# Patient Record
Sex: Male | Born: 1960 | Race: White | Hispanic: No | Marital: Married | Smoking: Never smoker
Health system: Southern US, Community
[De-identification: ages and names within clinical notes are randomized; demographics above are authoritative.]

## PROBLEM LIST (undated history)

## (undated) DIAGNOSIS — F32A Depression, unspecified: Secondary | ICD-10-CM

## (undated) DIAGNOSIS — E119 Type 2 diabetes mellitus without complications: Secondary | ICD-10-CM

## (undated) DIAGNOSIS — F329 Major depressive disorder, single episode, unspecified: Secondary | ICD-10-CM

## (undated) DIAGNOSIS — Z87442 Personal history of urinary calculi: Secondary | ICD-10-CM

## (undated) DIAGNOSIS — G629 Polyneuropathy, unspecified: Principal | ICD-10-CM

## (undated) DIAGNOSIS — G44059 Short lasting unilateral neuralgiform headache with conjunctival injection and tearing (SUNCT), not intractable: Secondary | ICD-10-CM

## (undated) DIAGNOSIS — Z9889 Other specified postprocedural states: Secondary | ICD-10-CM

## (undated) DIAGNOSIS — G4730 Sleep apnea, unspecified: Secondary | ICD-10-CM

## (undated) DIAGNOSIS — S069X9A Unspecified intracranial injury with loss of consciousness of unspecified duration, initial encounter: Secondary | ICD-10-CM

## (undated) DIAGNOSIS — I10 Essential (primary) hypertension: Secondary | ICD-10-CM

## (undated) DIAGNOSIS — M1990 Unspecified osteoarthritis, unspecified site: Secondary | ICD-10-CM

## (undated) DIAGNOSIS — IMO0001 Reserved for inherently not codable concepts without codable children: Secondary | ICD-10-CM

## (undated) DIAGNOSIS — G43909 Migraine, unspecified, not intractable, without status migrainosus: Secondary | ICD-10-CM

## (undated) HISTORY — PX: NASAL SEPTUM SURGERY: SHX37

## (undated) HISTORY — PX: CYSTOSCOPY W/ STONE MANIPULATION: SHX1427

## (undated) HISTORY — DX: Other specified postprocedural states: Z98.890

## (undated) HISTORY — DX: Migraine, unspecified, not intractable, without status migrainosus: G43.909

## (undated) HISTORY — DX: Polyneuropathy, unspecified: G62.9

## (undated) HISTORY — PX: NASAL FRACTURE SURGERY: SHX718

## (undated) HISTORY — PX: URETER SURGERY: SHX823

## (undated) HISTORY — DX: Sleep apnea, unspecified: G47.30

## (undated) HISTORY — DX: Short lasting unilateral neuralgiform headache with conjunctival injection and tearing (SUNCT), not intractable: G44.059

## (undated) HISTORY — PX: LITHOTRIPSY: SUR834

---

## 1980-04-07 HISTORY — PX: APPENDECTOMY: SHX54

## 1981-04-07 HISTORY — PX: NEPHROLITHOTOMY: SUR881

## 2012-07-25 ENCOUNTER — Other Ambulatory Visit: Payer: Self-pay | Admitting: Neurology

## 2012-07-30 ENCOUNTER — Other Ambulatory Visit: Payer: Self-pay | Admitting: Neurology

## 2012-08-06 ENCOUNTER — Ambulatory Visit: Payer: Self-pay | Admitting: Neurology

## 2012-08-11 ENCOUNTER — Other Ambulatory Visit: Payer: Self-pay | Admitting: Neurology

## 2012-08-19 ENCOUNTER — Encounter: Payer: Self-pay | Admitting: Neurology

## 2012-08-19 ENCOUNTER — Ambulatory Visit: Payer: Self-pay | Admitting: Neurology

## 2012-08-19 ENCOUNTER — Ambulatory Visit (INDEPENDENT_AMBULATORY_CARE_PROVIDER_SITE_OTHER): Payer: BC Managed Care – PPO | Admitting: Neurology

## 2012-08-19 VITALS — BP 119/82 | HR 71 | Ht 73.0 in | Wt 253.0 lb

## 2012-08-19 DIAGNOSIS — R110 Nausea: Secondary | ICD-10-CM

## 2012-08-19 DIAGNOSIS — G44059 Short lasting unilateral neuralgiform headache with conjunctival injection and tearing (SUNCT), not intractable: Secondary | ICD-10-CM

## 2012-08-19 DIAGNOSIS — G609 Hereditary and idiopathic neuropathy, unspecified: Secondary | ICD-10-CM

## 2012-08-19 DIAGNOSIS — G4733 Obstructive sleep apnea (adult) (pediatric): Secondary | ICD-10-CM

## 2012-08-19 HISTORY — DX: Short lasting unilateral neuralgiform headache with conjunctival injection and tearing (SUNCT), not intractable: G44.059

## 2012-08-19 MED ORDER — NONFORMULARY OR COMPOUNDED ITEM: Status: DC

## 2012-08-19 MED ORDER — ONDANSETRON HCL 4 MG PO TABS: 8.0000 mg | ORAL_TABLET | Freq: Three times a day (TID) | ORAL | Status: DC | PRN

## 2012-08-19 MED ORDER — TIZANIDINE HCL 4 MG PO CAPS: 4.0000 mg | ORAL_CAPSULE | Freq: Three times a day (TID) | ORAL | Status: DC | PRN

## 2012-08-19 NOTE — Addendum Note (Signed)
Addended by: Melvyn Novas on: 08/19/2012 02:48 PM   Modules accepted: Orders

## 2012-08-19 NOTE — Progress Notes (Signed)
[facility]  Provider:  Dr Vickey Huger Referring Provider: Primary Care Physician:  Jim Like, MD    HPI:  Leroy Williams is a 52 y.o. male , here  seen as a referral from NP [facility] for follow up on a sleep study and migraines.  The patient was established  in one previous visit to todays, his consult on 04-19-12.  The  Patient follows  a local pain clinic and  Was referred for an increase in migraine headaches ,which were formerly treated by Dr. Lilian Kapur at [facility]. When he first saw me on 04/21/2012, he was in need of having his beta blocker refilled and he rarely uses Percocet for migraine headaches.  He has described that his right eye is mostly affected with his headaches, but he also noticed flushing and tearing but the headache. SUNCT. During the evaluation he reported that he had been diagnosed with obstructive sleep apnea 3 years earlier but was unable to use his CPAP machine which was relatively no new. He feels his medication but also ordered a split night polysomnography for the patient. Date of the study was 13-Jan -2014.  Patient past medical history of diabetes, neuropathy, GERD, back pain, cholesterolemia, testosterone deficiency, kidney stones, migraine headaches, deviated nasal septum and rhinitis status post post rhinoplasty.  Restless leg syndrome and normal obstructive sleep apnea. When he originally was seen he had excessive daytime sleepiness and endorsed the Epworth sleepiness score at 19 points of 24 reported snoring bruxism, kicking and jerking of limbs and legs at night and excessive sweating during this during sleep.   The a H. I for the diagnostic part of the study was 39.2 the RDI was 46.4 the oxygen desaturation was into lowest at 81% with a total of 94.8 minutes at night in desaturation time. The patient had multilens and normal sinus rhythm. He was then titrated and at a CPAP pressure of 10 cm there was a reduction of the AHI to 0.0. Sleep latency  on CPAP was 4.5 minutes under CPAP he does take 20.3% slow-wave sleep and 24.9% REM sleep. There was still desaturation altered for 93.4 minutes while on CPAP. The patient there is no pulmonary primary diagnosis and is very well possible that his oxygen desaturations contribute meanly to his headaches. The patient owns his own machine which was to be the fact to 10 cm water pressure with an EPR. The patient reports to have used his CPAP now frequently, but nasal airflow is still uncomfortable.   The patient reports today that he just 10 minutes and well started to develop a headache involving the right side of the face he feels puffy and also surface here is under pressure and full he has some orbital pain.  The patient reports an increase in severe headaches in the spring, probably with allergies associated. He also states that his headaches start in the afternoon rather than in the morning. He feels that weather changes can bring headaches on as a trigger. By the intensity of his headaches can vary but day by day he verifies that for the last 4 weeks he had daily headaches.  Epworth is 20 points.         Review of Systems: Out of a complete 14 system review, the patient complains of only the following symptoms, and all other reviewed systems are negative. HA with weather changes, and in allergy season. , CPAP compliance is spotty. He did not bring the machine.   Rhinitis  Allergic.   History  Social History  . Marital Status: Married    Spouse Name: N/A    Number of Children: 2  . Years of Education: doctorate   Occupational History  . former principal and Surveyor, [employer]    Social History Main Topics  . Smoking status: Not on file  . Smokeless tobacco: Not on file  . Alcohol Use: Not on file  . Drug Use: Not on file  . Sexually Active: Not on file   Other Topics Concern  . Not on file   Social History Narrative   Patient is noncompliant  with CPAP use, known apnea, machine  is only 52 years old. He has chronic migraines, controlled on Metoprolol 100 mg bid. He described SUNCT headaches.    Need copy of sleep study, Hazle Coca, MD in [place] and from the [facility].    Percocet user, but rarely.  I will need a narcotic contract. 45 minutes.    SPLIT repeat to reset his long time unused machine at a better pressure. Insomnia with snoring and wife noticed ongoing apnea. He should not use an FFM, but nasal pillow or pilaire, wisp type.            Family History  Problem Relation Age of Onset  . Diabetes Father   . Kidney Stones Father   . Heart disease Paternal Grandfather     Past Medical History  Diagnosis Date  . Migraine   . Kidney stones   . Neuropathy   . Sleep apnea   . H/O rhinoplasty     Past Surgical History  Procedure Laterality Date  . Appendectomy  1982    age 61    Current Outpatient Prescriptions  Medication Sig Dispense Refill  . PRAMIPEXOLE DIHYDROCHLORIDE PO Take by mouth. 0.25 mg,1 with evening meal, 2 each night      . pravastatin (PRAVACHOL) 40 MG tablet Take 40 mg by mouth Nightly. One each night      . Testosterone (TESTOPEL) 75 MG PLLT by Implant route.       No current facility-administered medications for this visit.    Allergies as of 08/19/2012  . (Not on File)    Vitals: There were no vitals taken for this visit. Last Weight:  Wt Readings from Last 1 Encounters:  08/19/12 256 lb (116.121 kg)   Last Height:   Ht Readings from Last 1 Encounters:  08/19/12 6' (1.829 m)   Vision Screening:   Physical exam:  General: The patient is awake, alert and appears  in acute pain/  distress. The patient is well groomed. Head: Normocephalic, atraumatic. Neck is supple. Mallampati 3 , neck circumference: Cardiovascular:  Regular rate and rhythm, without  murmurs or carotid bruit, and without distended neck veins. Respiratory: Lungs are clear to auscultation. Skin:  Without evidence of edema, or  rash Trunk: BMI iselevated and patient  has normal posture.  Neurologic exam : The patient is awake and alert, oriented to place and time.  Memory subjective  described as intact. There is a normal attention span & concentration ability. Speech is fluent without  dysarthria, dysphonia or aphasia. Mood and affect are appropriate.  Cranial nerves: Pupils are equal and briskly reactive to light. Funduscopic exam without  evidence of pallor or edema- difficult to tolerate for this Headache patient.  Extraocular movements  in vertical and horizontal planes intact and without nystagmus. Visual fields by finger perimetry are intact. Right ptosis. He had botox on that side.  Hearing to  finger rub intact.  Facial sensation intact to fine touch. Facial motor strength is symmetric and tongue and uvula move midline. There is flushing over the face on both sides.   Motor exam:   Normal tone and normal muscle bulk and symmetric normal strength in all extremities.  Sensory:  Fine touch, pinprick and vibration were tested in all extremities. Proprioception is tested in the upper extremities only. This was  normal.  Coordination: Rapid alternating movements in the fingers/hands is tested and normal. Finger-to-nose maneuver tested and normal without evidence of ataxia, dysmetria or tremor.  Gait and station: Patient walks without assistive device , climbs up to the exam table. Strength within normal limits. Stance is stable and normal. Tandem gait is unfragmented. Romberg testing is normal.  Deep tendon reflexes: in the  upper and lower extremities are symmetric and intact. Babinski maneuver response is downgoing.   Assessment:  After physical and neurologic examination, review of laboratory studies, imaging, neurophysiology testing and pre-existing records, assessment will be reviewed on the problem list.  Plan:  Treatment plan and additional workup will be reviewed under Problem List.       Patient  Is  advised to use CPAP daily and establish a routine. Compliance  visits yearly. Driving restrictions apply.  Severe hemicrania and facial pain , controlled under beat blockers except during allergy season.  Ondansetron for  Nausea , orphenadrine tabs continued. Percocet for pain  Emergencies. Pain clinic follow up yesterday increased his gabapentin to 4 at night.  I prescribe toprol and Zofran, and suggested to discuss  a topical compound pain medication for neuropathy.

## 2012-08-19 NOTE — Patient Instructions (Signed)
SUNCT Headache Information A SUNCT headache is a short-lasting, intense headache affecting one side of the head. It is a rare form of headache. It is most common in men after age 52. The disorder is noticeable as bursts of moderate to severe burning, stabbing, or throbbing pain. It usually occurs on one side of the head and around the eye or temple. Attacks typically occur in daytime. Attacks typically last from 5 seconds to 4 minutes. Patients generally have 5 to 6 attacks per hour. SYMPTOMS   Watery eyes, reddish or bloodshot eyes caused by dilation of blood vessels (conjunctival injection).  Nasal congestion (stuffiness), runny nose.  Sweaty forehead.  Swelling of the eyelids.  Increased pressure within the eye on the affected side of head.  Systolic blood pressure may rise during the attacks. Movement of the neck may trigger these headaches. SUNCT headaches may be part of a broader disorder that causes intense pain in the eyes, lips, nose, scalp, forehead, and jaw (trigeminal neuralgia). SUNCT headaches are considered one of a group of headache disorders (trigeminal autonomic cephalgias, or TACs). TREATMENT  These headaches are generally not responsive to usual treatment for short-lasting headaches. Drugs that may relieve symptoms in some patients include:  Corticosteroids.  Anti-epileptic drugs:  Gabapentin.  Lamotrigine.  Carbamazepine. Studies have shown that injections of glycerol to block the facial nerves may provide immediate relief. Headaches recurred in about 40 percent of patients studied.  PROGNOSIS  There is no cure for these headaches. The disorder is not fatal but can cause considerable discomfort. Document Released: 02/15/2004 Document Revised: 06/16/2011 Document Reviewed: 03/12/2007 [facility] Patient Information 2013 [place], [place].

## 2012-08-25 ENCOUNTER — Other Ambulatory Visit: Payer: Self-pay | Admitting: Neurology

## 2012-08-25 DIAGNOSIS — G4733 Obstructive sleep apnea (adult) (pediatric): Secondary | ICD-10-CM

## 2012-12-08 ENCOUNTER — Telehealth: Payer: Self-pay | Admitting: Neurology

## 2012-12-08 NOTE — Telephone Encounter (Signed)
The patient will need to contact [facility] to scheduled refill on cream (since it has to be made each fill).  I called the patient back.  He said he currently sees a pain doctor and they claim they have called here twice to ask Dr Vickey Huger if she will add additional ingredients to the compound cream he uses for pain.  (By viewing the chart, we do not show any record of them calling.)  I suggested he ask the other doctor to start prescribing the cream, since he is now being treated through them for pain.  The patient will call the pain clinic to ask them to prescribe this med with the added ingredients they recommended and will call us back if needed.

## 2013-02-06 ENCOUNTER — Other Ambulatory Visit: Payer: Self-pay | Admitting: Neurology

## 2013-02-23 ENCOUNTER — Ambulatory Visit (INDEPENDENT_AMBULATORY_CARE_PROVIDER_SITE_OTHER): Payer: BC Managed Care – PPO | Admitting: Neurology

## 2013-02-23 ENCOUNTER — Encounter: Payer: Self-pay | Admitting: Neurology

## 2013-02-23 VITALS — BP 132/78 | HR 74 | Resp 17 | Ht 72.0 in | Wt 261.0 lb

## 2013-02-23 DIAGNOSIS — G44059 Short lasting unilateral neuralgiform headache with conjunctival injection and tearing (SUNCT), not intractable: Secondary | ICD-10-CM

## 2013-02-23 DIAGNOSIS — G609 Hereditary and idiopathic neuropathy, unspecified: Secondary | ICD-10-CM

## 2013-02-23 MED ORDER — ORPHENADRINE CITRATE ER 100 MG PO TB12: 100.0000 mg | ORAL_TABLET | Freq: Two times a day (BID) | ORAL | Status: DC | PRN

## 2013-02-23 MED ORDER — PROMETHAZINE HCL 50 MG PO TABS: 50.0000 mg | ORAL_TABLET | Freq: Four times a day (QID) | ORAL | Status: DC | PRN

## 2013-02-23 MED ORDER — NONFORMULARY OR COMPOUNDED ITEM: Status: DC

## 2013-02-23 NOTE — Patient Instructions (Signed)
Headaches, Analgesic Rebound Analgesic agents are prescription or over-the-counter medications used to control pain, including headaches. However, overuse or misuse of theses medications can lead to rebound headaches. Rebound headaches are headaches that recur after the analgesic medication wears off. Eventually, the rebound headaches can become longlasting (chronic). If this happens, you must completely stop using analgesic medications. If not, the chronic headache is likely to continue despite the use of any other treatment. Usually when you stop taking analgesic medications, the headache may initally get worse for several days. Along with this you may experience sickness in your stomach (nausea), and you may throw up (vomit). After a period of 3 to 5 days, these symptoms begin to improve. Sometimes improvement may take longer. Eventually, the headaches will slowly improve with treatment with the right medications. Most people are able to stop using analgesic medications at home with a caregiver's supervision. But some find it difficult and may require hospitalization.Trigeminal Neuralgia Trigeminal neuralgia is a nerve disorder that causes sudden attacks of severe facial pain. It is caused by damage to the trigeminal nerve, a major nerve in the face. It is more common in women and in the elderly, although it can also happen in younger patients. Attacks last from a few seconds to several minutes and can occur from a couple of times per year to several times per day. Trigeminal neuralgia can be a very distressing and disabling condition. Surgery may be needed in very severe cases if medical treatment does not give relief. HOME CARE INSTRUCTIONS   If your caregiver prescribed medication to help prevent attacks, take as directed.  To help prevent attacks:  Chew on the unaffected side of the mouth.  Avoid touching your face.  Avoid blasts of hot or cold air.  Men may wish to grow a beard to avoid  having to shave. SEEK IMMEDIATE MEDICAL CARE IF:  Pain is unbearable and your medicine does not help.  You develop new, unexplained symptoms (problems).  You have problems that may be related to a medication you are taking. Document Released: 03/21/2000 Document Revised: 06/16/2011 Document Reviewed: 01/19/2009 [facility] Patient Information 2014 [place], [place].  Document Released: 06/14/2003 Document Revised: 06/16/2011 Document Reviewed: 10/07/2012 [facility] Patient Information 2014 [place], [place].

## 2013-02-23 NOTE — Addendum Note (Signed)
Addended by: Melvyn Novas on: 02/23/2013 09:27 AM   Modules accepted: Orders

## 2013-02-23 NOTE — Progress Notes (Signed)
[facility]  Provider:  Melvyn Novas, M D  Referring Provider: Jim Like, MD Primary Care Physician:  Jim Like, MD    HPI:  Leroy Williams is a 52 y.o. male  Is seen here as a  revisit  from Dr. Andrey Campanile for sleep apnea follow up and headaches.     HPI: The patient  Was first seen in  consult on 04-19-12.  The  Patient follows a local pain clinic and was referred for an increase in migraine headaches ,which were formerly treated by Dr. Lilian Kapur at [facility]- Dr. Lilian Kapur is not longer at the headache clinic.  When he first saw me on 04/21/2012, he was in need of having his beta blocker refilled and he rarely uses Percocet for migraine headaches.  He has described that his right eye is mostly affected with his headaches, but he also noticed flushing and tearing but the headache. SUNCT. During the evaluation he reported that he had been diagnosed with obstructive sleep apnea 3 years earlier but was unable to use his CPAP machine which was relatively no new. He feels his medication but also ordered a split night polysomnography for the patient. Date of the study was 13-Jan -2014 . The AHI for the diagnostic part of the study was 39.2,  the RDI was 46.4 , the oxygen desaturation was  lowest at 81%,  with a total of 94.8 minutes at night in desaturation time.  The patient had a normal sinus rhythm.  He was then titrated to CPAP pressure of 10 cm  H20, and there was a significant  reduction of the AHI to 0.0.  Sleep latency on CPAP was 4.5 minutes under CPAP he does take 20.3% slow-wave sleep and 24.9% REM sleep.  There was still desaturation for 93.4 minutes while on CPAP. The patient carries no pulmonary primary diagnosis and it  is very well possible that his oxygen desaturations contribute meanly to his headaches. The patient owns his own machine which was to be reset  to 10 cm water pressure with an EPR of 2 cm water. .  Patient past medical  history of diabetes, neuropathy, GERD, back pain, cholesterolemia, testosterone deficiency, kidney stones, migraine headaches, deviated nasal septum and rhinitis status post post rhinoplasty.  Restless leg syndrome and normal obstructive sleep apnea. When he originally was seen he had excessive daytime sleepiness and endorsed the Epworth sleepiness score at 19 points of 24 reported snoring bruxism, kicking and jerking of limbs and legs at night and excessive sweating during this during sleep.  His download today was not obtained- DME will be [facility], he had a nasal mask fitted, but went back to FFM . He had no download of data forwarded to [facility], none accessible.  The patient reports to have used his CPAP now frequently, but nasal airflow is still uncomfortable.  He gets better better control of his headaches, which means that he not longer has the headaches in the morning. No hypnic headches, and not a CO2 retainer.  Headaches arise at or after lunch time and increase in intensity over the hours up to 5 Pm , when they peak. Usually takes  Orphenadrine and Excedrin at that time, and he takes medication for his painful neuropathy.      Epworth is 17 from 20  points,  FSS 55 points GDS of 7 points. No falls.         Review of Systems: Out of a complete 14 system review, the  patient complains of only the following symptoms, and all other reviewed systems are negative. HA with weather changes, and in allergy season. , CPAP compliance is spotty. He did bring the machine- but no download in office is possible. .   Rhinitis  Allergic.  FSS high , Epworth high, GDS high.   History   Social History  . Marital Status: Married    Spouse Name: N/A    Number of Children: 2  . Years of Education: doctorate   Occupational History  . former principal and Surveyor, [employer]    Social History Main Topics  . Smoking status: Never Smoker   . Smokeless tobacco: Not on file  . Alcohol Use: Yes      Comment: occasionally  . Drug Use: No  . Sexual Activity: Not on file   Other Topics Concern  . Not on file   Social History Narrative   Patient is noncompliant  with CPAP use, known apnea, machine is only 52 years old. He has chronic migraines, controlled on Metoprolol 100 mg bid. He described SUNCT headaches.    Need copy of sleep study, Hazle Coca, MD in [place] and from the [facility].    Percocet user, but rarely.  I will need a narcotic contract. 45 minutes.    SPLIT repeat to reset his long time unused machine at a better pressure. Insomnia with snoring and wife noticed ongoing apnea. He should not use an FFM, but nasal pillow or pilaire, wisp type.            Family History  Problem Relation Age of Onset  . Diabetes Father   . Kidney Stones Father   . Heart disease Paternal Grandfather     Past Medical History  Diagnosis Date  . Migraine   . Kidney stones   . Neuropathy   . Sleep apnea   . H/O rhinoplasty   . Short lasting unilateral neuralgiform headache with conjunctival injection and tearing (sunct), not intractable 08/19/2012    Past Surgical History  Procedure Laterality Date  . Appendectomy  1982    age 27    Current Outpatient Prescriptions  Medication Sig Dispense Refill  . buPROPion (WELLBUTRIN XL) 300 MG 24 hr tablet       . DULoxetine (CYMBALTA) 60 MG capsule       . GRALISE 600 MG TABS 2,400 mg 1 day or 1 dose. All 4 tabs at night      . metoprolol (LOPRESSOR) 100 MG tablet 25 mg in AM and lunchtime, and 100 mg at night.      . metoprolol succinate (TOPROL-XL) 25 MG 24 hr tablet       . NONFORMULARY OR COMPOUNDED ITEM 6 a plus clonidine. [facility].  240 each  1  . potassium citrate (UROCIT-K) 10 MEQ (1080 MG) SR tablet       . PRAMIPEXOLE DIHYDROCHLORIDE PO Take by mouth. 0.25 mg,1 with evening meal, 2 each night      . pravastatin (PRAVACHOL) 40 MG tablet Take 40 mg by mouth Nightly. One each night      .  PRILOSEC OTC 20 MG tablet       . tamsulosin (FLOMAX) 0.4 MG CAPS       . testosterone cypionate (DEPOTESTOTERONE CYPIONATE) 200 MG/ML injection Injection      . traZODone (DESYREL) 50 MG tablet       . diazepam (VALIUM) 5 MG tablet       . ondansetron (ZOFRAN)  4 MG tablet Take 2 tablets (8 mg total) by mouth every 8 (eight) hours as needed for nausea.  60 tablet  1  . orphenadrine (NORFLEX) 100 MG tablet       . oxyCODONE-acetaminophen (PERCOCET/ROXICET) 5-325 MG per tablet       . tiZANidine (ZANAFLEX) 4 MG capsule TAKE 1 CAPSULE (4 MG TOTAL) BY MOUTH 3 (THREE) TIMES DAILY AS NEEDED FOR MUSCLE SPASMS.  90 capsule  0   No current facility-administered medications for this visit.    Allergies as of 02/23/2013 - Review Complete 02/23/2013  Allergen Reaction Noted  . Codeine Itching 02/23/2013  . Ivp dye [iodinated diagnostic agents] Other (See Comments) 02/23/2013    Vitals: BP 132/78  Pulse 74  Resp 17  Ht 6' (1.829 m)  Wt 261 lb (118.389 kg)  BMI 35.39 kg/m2 Last Weight:  Wt Readings from Last 1 Encounters:  02/23/13 261 lb (118.389 kg)   Last Height:   Ht Readings from Last 1 Encounters:  02/23/13 6' (1.829 m)   Vision Screening:   Physical exam:  General: The patient is awake, alert and appears  in acute pain/  distress. The patient is well groomed. Head: Normocephalic, atraumatic. Neck is supple. Mallampati 3 , neck circumference: Cardiovascular:  Regular rate and rhythm, without  murmurs or carotid bruit, and without distended neck veins. Respiratory: Lungs are clear to auscultation. Skin:  Without evidence of edema, or rash Trunk: BMI iselevated and patient  has normal posture.  Neurologic exam : The patient is awake and alert, oriented to place and time.  Memory subjective  described as intact. There is a normal attention span & concentration ability.  Speech is fluent without  dysarthria, dysphonia or aphasia. Mood and affect are depressed.  Cranial  nerves: Pupils are equal and briskly reactive to light. Funduscopic exam without  evidence of pallor or edema-  Again, difficult to tolerate for this Headache patient.   Extraocular movements  in vertical and horizontal planes intact and without nystagmus. Visual fields by finger perimetry are intact. Right ptosis. He had botox on that side.  Hearing to finger rub intact.  Facial sensation intact to fine touch.  Facial motor strength is symmetric and tongue and uvula move midline. There is flushing over the face on both sides.   Motor exam:   Normal tone and normal muscle bulk and symmetric strength in all extremities.  Sensory:  Fine touch, pinprick and vibration were tested in all extremities. Proprioception is tested in the upper extremities only. This was  normal.  Coordination: Rapid alternating movements in the fingers/hands is tested and normal. Finger-to-nose maneuver without evidence of ataxia, dysmetria or tremor.  Gait and station: Patient walks without assistive device , climbs up to the exam table without assistence .Tandem gait is unfragmented.  Deep tendon reflexes: in the  upper and lower extremities are symmetric and intact. Babinski maneuver response is downgoing.   Assessment:  After physical and neurologic examination, review of laboratory studies, imaging, neurophysiology testing and pre-existing records,  0) OSA - unable to establish compliance in this patient with a non downloadable  BiPAP machine and reported spotty compliance.  1)Headache: chronic severe headaches - primary headaches.  I reviewed the letter form his pain clinic, I will be happy to prescribe the topical compound for the patient, and this time add clonidine to the Prescription.  2)sensory neuropathy : burning skin , peripheral neuropathy, developing over the last 3 years.  Followed by pain clinic, compound -  pharmaceuticals can be used to the feet , too.  3) patient reports some ataxia, not constant .  That's while his feet are burning.    Plan:  Treatment plan and additional workup as above . Compound 6a plus clonidine , serumprotein phoresis, e- lyte panel. ANA and sjoegrens panel.       Patient  Is advised to use CPAP daily and establish a routine. Compliance  visits yearly. Driving restrictions apply.  Severe hemicrania and facial pain , controlled under beat blockers except during allergy season.  Ondansetron for  Nausea , orphenadrine tabs continued. Percocet for pain  Emergencies. Pain clinic follow up yesterday increased his gabapentin to 4 at night.  I prescribed Toprol and Zofran, and suggested to continue the  topical compound pain medication for neuropathy.   I used [facility] for a refill. .                     Review of Systems: Out of a complete 14 system review, the patient complains of only the following symptoms, and all other reviewed systems are negative.   History   Social History  . Marital Status: Married    Spouse Name: N/A    Number of Children: 2  . Years of Education: doctorate   Occupational History  . former principal and Surveyor, [employer]    Social History Main Topics  . Smoking status: Never Smoker   . Smokeless tobacco: Not on file  . Alcohol Use: Yes     Comment: occasionally  . Drug Use: No  . Sexual Activity: Not on file   Other Topics Concern  . Not on file   Social History Narrative   Patient is noncompliant  with CPAP use, known apnea, machine is only 52 years old. He has chronic migraines, controlled on Metoprolol 100 mg bid. He described SUNCT headaches.    Need copy of sleep study, Hazle Coca, MD in [place] and from the [facility].    Percocet user, but rarely.  I will need a narcotic contract. 45 minutes.    SPLIT repeat to reset his long time unused machine at a better pressure. Insomnia with snoring and wife noticed ongoing apnea. He should not use an FFM, but nasal pillow  or pilaire, wisp type.            Family History  Problem Relation Age of Onset  . Diabetes Father   . Kidney Stones Father   . Heart disease Paternal Grandfather     Past Medical History  Diagnosis Date  . Migraine   . Kidney stones   . Neuropathy   . Sleep apnea   . H/O rhinoplasty   . Short lasting unilateral neuralgiform headache with conjunctival injection and tearing (sunct), not intractable 08/19/2012    Past Surgical History  Procedure Laterality Date  . Appendectomy  1982    age 19    Current Outpatient Prescriptions  Medication Sig Dispense Refill  . buPROPion (WELLBUTRIN XL) 300 MG 24 hr tablet       . DULoxetine (CYMBALTA) 60 MG capsule       . GRALISE 600 MG TABS 2,400 mg 1 day or 1 dose. All 4 tabs at night      . metoprolol (LOPRESSOR) 100 MG tablet 25 mg in AM and lunchtime, and 100 mg at night.      . metoprolol succinate (TOPROL-XL) 25 MG 24 hr tablet       .  NONFORMULARY OR COMPOUNDED ITEM 6 a plus clonidine. [facility].  240 each  1  . potassium citrate (UROCIT-K) 10 MEQ (1080 MG) SR tablet       . PRAMIPEXOLE DIHYDROCHLORIDE PO Take by mouth. 0.25 mg,1 with evening meal, 2 each night      . pravastatin (PRAVACHOL) 40 MG tablet Take 40 mg by mouth Nightly. One each night      . PRILOSEC OTC 20 MG tablet       . tamsulosin (FLOMAX) 0.4 MG CAPS       . testosterone cypionate (DEPOTESTOTERONE CYPIONATE) 200 MG/ML injection Injection      . traZODone (DESYREL) 50 MG tablet       . diazepam (VALIUM) 5 MG tablet       . ondansetron (ZOFRAN) 4 MG tablet Take 2 tablets (8 mg total) by mouth every 8 (eight) hours as needed for nausea.  60 tablet  1  . orphenadrine (NORFLEX) 100 MG tablet       . oxyCODONE-acetaminophen (PERCOCET/ROXICET) 5-325 MG per tablet       . tiZANidine (ZANAFLEX) 4 MG capsule TAKE 1 CAPSULE (4 MG TOTAL) BY MOUTH 3 (THREE) TIMES DAILY AS NEEDED FOR MUSCLE SPASMS.  90 capsule  0   No current facility-administered  medications for this visit.    Allergies as of 02/23/2013 - Review Complete 02/23/2013  Allergen Reaction Noted  . Codeine Itching 02/23/2013  . Ivp dye [iodinated diagnostic agents] Other (See Comments) 02/23/2013    Vitals: BP 132/78  Pulse 74  Resp 17  Ht 6' (1.829 m)  Wt 261 lb (118.389 kg)  BMI 35.39 kg/m2 Last Weight:  Wt Readings from Last 1 Encounters:  02/23/13 261 lb (118.389 kg)   Last Height:   Ht Readings from Last 1 Encounters:  02/23/13 6' (1.829 m)

## 2013-02-24 LAB — CBC WITH DIFFERENTIAL/PLATELET
Basos: 1 %
Eos: 2 %
HCT: 45.3 % (ref 37.5–51.0)
Immature Grans (Abs): 0 10*3/uL (ref 0.0–0.1)
Immature Granulocytes: 0 %
Lymphocytes Absolute: 2.3 10*3/uL (ref 0.7–3.1)
MCHC: 34.9 g/dL (ref 31.5–35.7)
MCV: 98 fL — ABNORMAL HIGH (ref 79–97)
Monocytes Absolute: 0.7 10*3/uL (ref 0.1–0.9)
Monocytes: 9 %
Neutrophils Absolute: 4.2 10*3/uL (ref 1.4–7.0)
RBC: 4.62 x10E6/uL (ref 4.14–5.80)
RDW: 15.2 % (ref 12.3–15.4)
WBC: 7.4 10*3/uL (ref 3.4–10.8)

## 2013-02-24 LAB — COMPREHENSIVE METABOLIC PANEL
Albumin: 4.6 g/dL (ref 3.5–5.5)
BUN/Creatinine Ratio: 16 (ref 9–20)
BUN: 17 mg/dL (ref 6–24)
CO2: 29 mmol/L (ref 18–29)
Calcium: 9.7 mg/dL (ref 8.7–10.2)
Creatinine, Ser: 1.05 mg/dL (ref 0.76–1.27)
Globulin, Total: 1.8 g/dL (ref 1.5–4.5)

## 2013-02-24 LAB — ANA W/REFLEX IF POSITIVE: Anti Nuclear Antibody(ANA): NEGATIVE

## 2013-03-08 ENCOUNTER — Encounter: Payer: Self-pay | Admitting: Neurology

## 2013-03-09 ENCOUNTER — Telehealth: Payer: Self-pay | Admitting: Neurology

## 2013-03-09 DIAGNOSIS — G4733 Obstructive sleep apnea (adult) (pediatric): Secondary | ICD-10-CM

## 2013-03-09 NOTE — Telephone Encounter (Signed)
Pt's cpap is over 52 years old.  Leroy Williams has contacted our office because the machine cannot download.  Please submit an order for the patient to receive a new unit: 10 cmH2O with EPR at 2 cmH2O

## 2013-03-10 NOTE — Progress Notes (Signed)
I called the pharmacy at [phone].  Spoke with [facility], she said the patients meds were shipped already and should be arriving in the next couple days.  I called the patient to advise.  He is aware.

## 2013-04-18 ENCOUNTER — Encounter: Payer: Self-pay | Admitting: Neurology

## 2013-05-02 ENCOUNTER — Telehealth: Payer: Self-pay | Admitting: Neurology

## 2013-05-02 NOTE — Telephone Encounter (Signed)
Patient requesting an earlier appointment than his scheduled one in May due to painful neuropathy in his feet and hands. Please call to advise.

## 2013-05-03 NOTE — Telephone Encounter (Signed)
Called and scheduled patient for sooner appt.

## 2013-05-16 ENCOUNTER — Encounter: Payer: Self-pay | Admitting: Neurology

## 2013-05-18 ENCOUNTER — Encounter: Payer: Self-pay | Admitting: Neurology

## 2013-05-18 ENCOUNTER — Ambulatory Visit (INDEPENDENT_AMBULATORY_CARE_PROVIDER_SITE_OTHER): Payer: BC Managed Care – PPO | Admitting: Neurology

## 2013-05-18 ENCOUNTER — Telehealth: Payer: Self-pay | Admitting: Neurology

## 2013-05-18 ENCOUNTER — Encounter (INDEPENDENT_AMBULATORY_CARE_PROVIDER_SITE_OTHER): Payer: Self-pay

## 2013-05-18 VITALS — BP 125/79 | HR 73 | Resp 17 | Ht 72.25 in | Wt 258.0 lb

## 2013-05-18 DIAGNOSIS — G589 Mononeuropathy, unspecified: Secondary | ICD-10-CM

## 2013-05-18 DIAGNOSIS — E1142 Type 2 diabetes mellitus with diabetic polyneuropathy: Secondary | ICD-10-CM

## 2013-05-18 DIAGNOSIS — I951 Orthostatic hypotension: Secondary | ICD-10-CM | POA: Insufficient documentation

## 2013-05-18 DIAGNOSIS — G603 Idiopathic progressive neuropathy: Secondary | ICD-10-CM

## 2013-05-18 DIAGNOSIS — G629 Polyneuropathy, unspecified: Secondary | ICD-10-CM | POA: Insufficient documentation

## 2013-05-18 DIAGNOSIS — G903 Multi-system degeneration of the autonomic nervous system: Secondary | ICD-10-CM

## 2013-05-18 DIAGNOSIS — G238 Other specified degenerative diseases of basal ganglia: Secondary | ICD-10-CM

## 2013-05-18 DIAGNOSIS — G608 Other hereditary and idiopathic neuropathies: Secondary | ICD-10-CM

## 2013-05-18 MED ORDER — PRAMIPEXOLE DIHYDROCHLORIDE 0.5 MG PO TABS: 0.5000 mg | ORAL_TABLET | Freq: Three times a day (TID) | ORAL | Status: AC

## 2013-05-18 NOTE — Patient Instructions (Signed)

## 2013-05-18 NOTE — Telephone Encounter (Signed)
Patient at check out today states that Dr. Vickey Hugerohmeier mentioned something about a skin biopsy with Dr. Terrace ArabiaYan. Please call patient and give clarification.

## 2013-05-18 NOTE — Progress Notes (Signed)
[facility]  Provider:  Melvyn Williams, M D  Referring Provider: Jim Like, MD Primary Care Physician:  Leroy Like, MD    HPI:  Leroy Williams is a 53 y.o. male  Is seen here as a  revisit  from Leroy Williams for sleep apnea follow up and headaches. He is diabetic and obese.   Leroy Williams has been doing well with the treatment of his sleep apnea but unfortunately developed a progressive neuropathy of which the baseline was already known this forces him to pace his whole at night and he has been less able to get restful sleep and therefore there is less aware at time with CPAP use as well. His compliance weight is unfortunately down to 55% but the CPAP is used his AHI is reduced from 39.2 3.8 apneas per hour.  Based on this I strongly recommend for him to continue using it as much as possible his CPAP is set at 10 cm water and the download report was made available to [place] on 04-18-13 by [employer],  his durable medical equipment company.  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 In the cold, his headache is improved, he doesn't sweat as he does in warm weather.  His feet have been very cold and in excruciating pain, his hands do not turn blue. He feels he sweats too much, is heat intolerant and changes  shirts every 6 hours.   He has dry mouth and eyes, and I noted a large tongue. In the morning he feels dysarthric, clumsy tongue.  He has no known arsenic, lead or thallium exposure.       HPI: First seen in  consult on 04-19-12. The  Patient follows a local pain clinic and was referred for an increase in migraine  headaches ,which were formerly treated by Leroy Williams at [facility]- Leroy Williams is not longer at the headache clinic.  When he first saw me on 04/21/2012, he was in need of having his beta blocker refilled and he rarely uses Percocet for migraine headaches.  He has described that his right eye is mostly affected with his headaches, but he also noticed flushing and tearing but the headache. SUNCT. During the evaluation he reported that he had been diagnosed with obstructive sleep apnea 3 years earlier but was unable to use his CPAP machine which was relatively no new. He feels his medication but also ordered a split night polysomnography for the patient. Date of the study was 13-Jan -2014 . The AHI for the diagnostic part of the study was 39.2,  the RDI was 46.4 , the oxygen desaturation was  lowest at 81%,  with a total of 94.8 minutes at night in desaturation time. The patient had a normal sinus rhythm.  He was then titrated to CPAP pressure of 10 cm  H20, and there was a significant  reduction of the AHI to 0.0.  Sleep latency on CPAP was 4.5 minutes under CPAP he does take 20.3% slow-wave sleep and 24.9% REM sleep.  There was still desaturation for 93.4 minutes while on CPAP. The patient carries no pulmonary primary diagnosis and it  is very well possible that his oxygen desaturations contribute meanly to his headaches. The patient owns his own machine which was to be reset  to 10 cm water pressure with an EPR of 2 cm water. .  Patient past medical history of diabetes, neuropathy, GERD, back pain, cholesterolemia, testosterone deficiency, kidney stones, migraine headaches, deviated nasal septum and rhinitis status post post rhinoplasty.  Restless leg syndrome and normal obstructive sleep apnea. When he originally was seen he had excessive daytime sleepiness and endorsed the Epworth sleepiness score at 19 points of 24 reported snoring bruxism, kicking and jerking of limbs and legs at night and  excessive sweating during this during sleep.  His download today was not obtained- DME will be [employer], he had a nasal mask fitted, but went back to FFM . He had no download of data forwarded to [employer], none accessible.  The patient reports to have used his CPAP now frequently, but nasal airflow is still uncomfortable.  He gets better better control of his headaches, which means that he not longer has the headaches in the morning. No hypnic headches, and not a CO2 retainer.  Headaches arise at or after lunch time and increase in intensity over the hours up to 5 Pm , when they peak. Usually takes  Orphenadrine and Excedrin at that time, and he takes medication for his painful neuropathy.   Review of Systems: Out of a complete 14 system review, the patient complains of only the following symptoms, and all other reviewed systems are negative. HA with weather changes, and in allergy season. , CPAP compliance is spotty. He did bring the machine- but no download in office is possible.  Epworth is  15 on 05-18-13 , last visit 17 from 20  Points at time of sleep study,  FSS 55 points GDS of 7 points. No falls.     Rhinitis  Allergic.  FSS high , Epworth high, GDS high.   History   Social History  . Marital Status: Married    Spouse Name: Leroy Williams    Number of Children: 2  . Years of Education: doctorate   Occupational History  . former principal and Surveyor, [employer]    Social History Main Topics  . Smoking status: Never Smoker   . Smokeless tobacco: Never Used  . Alcohol Use: Yes     Comment: occasionally  . Drug Use: No  . Sexual Activity: Not on file   Other Topics Concern  . Not on file   Social History Narrative   Patient is noncompliant  with CPAP use, known apnea, machine is only 53 years old. He has chronic migraines, controlled on Metoprolol 100 mg bid. He described SUNCT headaches.    Need copy of sleep study, Leroy Coca, MD in [place] and from the [facility].     Percocet user, but rarely.  I will need a narcotic contract. 45 minutes.    SPLIT repeat to reset his long time unused machine at a better pressure. Insomnia with snoring and wife noticed ongoing apnea. He should not use an FFM, but nasal pillow or pilaire, wisp type.     Patient is married Leroy Williams) and lives at home with his wife.   Patient has two daughters.   Patient is retired.   Patient has a Animator.   Patient is right-handed.   Patient drinks some tea and sodas- 2-3 a week.             Family History  Problem Relation Age of Onset  . Diabetes Father   . Kidney Stones Father   . Heart disease Paternal Grandfather     Past Medical History  Diagnosis Date  . Migraine   . Kidney stones   . Neuropathy   . Sleep apnea   . H/O rhinoplasty   . Short lasting unilateral neuralgiform headache with conjunctival injection and tearing (sunct), not intractable 08/19/2012  . Neuropathy     Past Surgical History  Procedure Laterality Date  . Appendectomy  1982    age 35    Current Outpatient Prescriptions  Medication Sig Dispense Refill  . buPROPion (WELLBUTRIN XL) 300 MG 24 hr tablet       . diazepam (VALIUM) 5 MG tablet       . DULoxetine (CYMBALTA) 60 MG capsule       . fluticasone (FLONASE) 50 MCG/ACT nasal spray       . GRALISE 600 MG TABS 2,400 mg 1 day or 1 dose. All 4 tabs at night      . metoprolol succinate (TOPROL-XL) 25 MG 24 hr tablet       . NONFORMULARY OR COMPOUNDED ITEM 6 a plus  tramadol . [employer].  240 each  5  . orphenadrine (NORFLEX) 100 MG tablet Take 1 tablet (100 mg total) by mouth 2 (two) times daily as needed for muscle spasms.  60 tablet  5  . oxyCODONE-acetaminophen (PERCOCET/ROXICET) 5-325 MG per tablet       . potassium citrate (UROCIT-K) 10 MEQ (1080 MG) SR tablet       . PRAMIPEXOLE DIHYDROCHLORIDE PO Take by mouth. 0.25 mg,1 with evening meal, 2 each night      . pravastatin (PRAVACHOL) 40 MG tablet Take 40 mg by  mouth Nightly. One each night      . PRILOSEC OTC 20 MG tablet       . promethazine (PHENERGAN) 50 MG tablet Take 1 tablet (50 mg total) by mouth every 6 (six) hours as needed for nausea or vomiting.  30 tablet  3  . TAMIFLU 75 MG capsule       . tamsulosin (FLOMAX) 0.4 MG CAPS       . testosterone cypionate (DEPOTESTOTERONE CYPIONATE) 200 MG/ML injection every 14 (fourteen) days. Injection      . tiZANidine (ZANAFLEX) 4 MG capsule TAKE 1 CAPSULE (4 MG TOTAL) BY MOUTH 3 (THREE) TIMES DAILY AS NEEDED FOR MUSCLE SPASMS.  90 capsule  0  . traZODone (DESYREL) 50 MG tablet        No current facility-administered medications for this visit.    Allergies as of 05/18/2013 - Review Complete 05/18/2013  Allergen Reaction Noted  . Codeine Itching 02/23/2013  . Ivp dye [iodinated diagnostic agents] Other (See Comments) 02/23/2013    Vitals: BP 125/79  Pulse 73  Resp 17  Ht 6' 0.25" (1.835 m)  Wt 258 lb (117.028 kg)  BMI 34.76 kg/m2 Last Weight:  Wt Readings from Last 1 Encounters:  05/18/13 258 lb (117.028 kg)   Last Height:   Ht Readings from Last 1 Encounters:  05/18/13 6' 0.25" (1.835 m)   Vision Screening:   Physical exam:  General: The patient is awake, alert and appears  in acute pain/  distress. The patient is well groomed. Head: Normocephalic, atraumatic. Neck is supple. Mallampati 3 , neck circumference: Cardiovascular:  Regular rate and rhythm, without  murmurs or carotid bruit, and without distended neck veins. Respiratory: Lungs are clear to auscultation.  Skin:  Without evidence of edema, or rash Trunk: BMI iselevated and patient  has normal posture.  Neurologic exam : The patient is awake and alert, oriented to place and time.  Memory subjective  described as intact. There is a normal attention span & concentration ability.  Speech is fluent without  dysarthria, dysphonia or aphasia. Mood and affect are depressed.  Cranial nerves: Pupils are equal and briskly  reactive to light. Funduscopic exam without  evidence of pallor or edema-  Again, difficult to tolerate for this Headache patient.   Extraocular movements  in vertical and horizontal planes intact and without nystagmus. Visual fields by finger perimetry are intact. Right ptosis. He had botox on that side.  Hearing to finger rub intact.  Facial sensation intact to fine touch.  Facial motor strength is symmetric and tongue and uvula move midline. There is flushing over the face on both sides.   Motor exam:   Normal tone and normal muscle bulk and symmetric strength in all extremities.  Sensory:  Fine touch, pinprick and vibration were tested in all extremities. Proprioception is tested in the upper extremities only. This was  normal.  Coordination: Rapid alternating movements in the fingers/hands is tested and normal. Finger-to-nose maneuver without evidence of ataxia, dysmetria or tremor.  Gait and station: Patient walks without assistive device , climbs up to the exam table without assistence .Tandem gait is unfragmented.  Deep tendon reflexes: in the  upper and lower extremities are symmetric and intact. Babinski maneuver response is downgoing.   Assessment:  After physical and neurologic examination, review of laboratory studies, imaging, neurophysiology testing and pre-existing records,  0) OSA -  established compliance in this patient with a new  downloadable  BiPAP machine and reported spotty compliance.  He is in pain and paces his house at night , he reports, but even 3 hours of use have had an affect on his Epworth.  1)Headache: chronic severe headaches - primary headaches. I reviewed the letter form his pain clinic, I will be happy to prescribe the topical compound for the patient, and this time add clonidine to the Prescription.  2)sensory neuropathy : burning skin , peripheral neuropathy, developing over the last 3 years.  Followed by pain clinic, compound - pharmaceuticals can be used  to the feet , too.  3) patient reports some ataxia, not constant . That's while his feet are burning. 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    Plan:  Treatment plan and additional workup as above .  Last visit : Compound 6a plus clonidine  Were prescribed ,  serumprotein phoresis, e- lyte panel. ANA and sjoegrens panel.- results were related .         Patient  Is advised to use CPAP daily and establish a routine. Compliance  visits yearly. Driving restrictions apply.  Severe hemicrania and facial pain , controlled under beat blockers except during allergy season.  Ondansetron for  Nausea , orphenadrine tabs continued. Percocet for pain  Emergencies. Pain clinic follow up yesterday increased his gabapentin to 4 at night.  I prescribed Toprol and Zofran, and suggested to continue the  topical compound pain medication for neuropathy.   I used [facility] for a refill. .Marland Kitchen  Review of Systems: Out of a complete 14 system review, the patient complains of only the following symptoms, and all other reviewed systems are negative.   History   Social History  . Marital Status: Married    Spouse Name: Leroy Williams    Number of Children: 2  . Years of Education: doctorate   Occupational History  . former principal and Surveyor, [employer]    Social History Main Topics  . Smoking status: Never Smoker   . Smokeless tobacco: Never Used  . Alcohol Use: Yes     Comment: occasionally  . Drug Use: No  . Sexual Activity: Not on file   Other Topics Concern  . Not on file   Social History Narrative   Patient is noncompliant  with CPAP use, known apnea, machine is only 53 years old. He has chronic migraines, controlled on Metoprolol 100 mg bid. He described SUNCT headaches.    Need copy of  sleep study, Leroy Coca, MD in [place] and from the [facility].    Percocet user, but rarely.  I will need a narcotic contract. 45 minutes.    SPLIT repeat to reset his long time unused machine at a better pressure. Insomnia with snoring and wife noticed ongoing apnea. He should not use an FFM, but nasal pillow or pilaire, wisp type.     Patient is married Leroy Williams) and lives at home with his wife.   Patient has two daughters.   Patient is retired.   Patient has a Animator.   Patient is right-handed.   Patient drinks some tea and sodas- 2-3 a week.             Family History  Problem Relation Age of Onset  . Diabetes Father   . Kidney Stones Father   . Heart disease Paternal Grandfather     Past Medical History  Diagnosis Date  . Migraine   . Kidney stones   . Neuropathy   . Sleep apnea   . H/O rhinoplasty   . Short lasting unilateral neuralgiform headache with conjunctival injection and tearing (sunct), not intractable 08/19/2012  . Neuropathy     Past Surgical History  Procedure Laterality Date  . Appendectomy  1982    age 53    Current Outpatient Prescriptions  Medication Sig Dispense Refill  . buPROPion (WELLBUTRIN XL) 300 MG 24 hr tablet       . diazepam (VALIUM) 5 MG tablet       . DULoxetine (CYMBALTA) 60 MG capsule       . fluticasone (FLONASE) 50 MCG/ACT nasal spray       . GRALISE 600 MG TABS 2,400 mg 1 day or 1 dose. All 4 tabs at night      . metoprolol succinate (TOPROL-XL) 25 MG 24 hr tablet       . NONFORMULARY OR COMPOUNDED ITEM 6 a plus  tramadol . [employer].  240 each  5  . orphenadrine (NORFLEX) 100 MG tablet Take 1 tablet (100 mg total) by mouth 2 (two) times daily as needed for muscle spasms.  60 tablet  5  . oxyCODONE-acetaminophen (PERCOCET/ROXICET) 5-325 MG per tablet       . potassium citrate (UROCIT-K) 10 MEQ (1080 MG) SR tablet       . PRAMIPEXOLE DIHYDROCHLORIDE PO Take by mouth. 0.25 mg,1 with  evening meal, 2 each night      . pravastatin (PRAVACHOL) 40 MG tablet Take 40 mg by mouth Nightly. One  each night      . PRILOSEC OTC 20 MG tablet       . promethazine (PHENERGAN) 50 MG tablet Take 1 tablet (50 mg total) by mouth every 6 (six) hours as needed for nausea or vomiting.  30 tablet  3  . TAMIFLU 75 MG capsule       . tamsulosin (FLOMAX) 0.4 MG CAPS       . testosterone cypionate (DEPOTESTOTERONE CYPIONATE) 200 MG/ML injection every 14 (fourteen) days. Injection      . tiZANidine (ZANAFLEX) 4 MG capsule TAKE 1 CAPSULE (4 MG TOTAL) BY MOUTH 3 (THREE) TIMES DAILY AS NEEDED FOR MUSCLE SPASMS.  90 capsule  0  . traZODone (DESYREL) 50 MG tablet        No current facility-administered medications for this visit.    Allergies as of 05/18/2013 - Review Complete 05/18/2013  Allergen Reaction Noted  . Codeine Itching 02/23/2013  . Ivp dye [iodinated diagnostic agents] Other (See Comments) 02/23/2013    Vitals: BP 125/79  Pulse 73  Resp 17  Ht 6' 0.25" (1.835 m)  Wt 258 lb (117.028 kg)  BMI 34.76 kg/m2 Last Weight:  Wt Readings from Last 1 Encounters:  05/18/13 258 lb (117.028 kg)   Last Height:   Ht Readings from Last 1 Encounters:  05/18/13 6' 0.25" (1.835 m)

## 2013-05-19 NOTE — Telephone Encounter (Signed)
I think it's safe to continue the anodyne treatment.  CD

## 2013-05-19 NOTE — Telephone Encounter (Signed)
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 This is optional testing , but I like for Dr Terrace ArabiaYan to evaluate th patient by NCS and EMG and decide if biopsy would be necessary.

## 2013-05-19 NOTE — Telephone Encounter (Signed)
Spoke with patient and wanted to know about the skin biopsy discussed during last office visit

## 2013-05-20 LAB — PROTEIN ELECTROPHORESIS, SERUM
A/G RATIO SPE: 1.7 (ref 0.7–2.0)
Albumin ELP: 4.3 g/dL (ref 3.2–5.6)
Alpha 1: 0.2 g/dL (ref 0.1–0.4)
Alpha 2: 0.7 g/dL (ref 0.4–1.2)
BETA: 1.1 g/dL (ref 0.6–1.3)
GLOBULIN, TOTAL: 2.5 g/dL (ref 2.0–4.5)
Gamma Globulin: 0.6 g/dL (ref 0.5–1.6)
TOTAL PROTEIN: 6.8 g/dL (ref 6.0–8.5)

## 2013-05-20 NOTE — Telephone Encounter (Signed)
Chart reviewed: I will arrange EMG nerve conduction study first, if this is normal, consider skin biopsy.  Dana: Please get skin biopsy kit, but will hold off until EMG nerve conduction study,

## 2013-05-25 ENCOUNTER — Other Ambulatory Visit: Payer: BC Managed Care – PPO

## 2013-05-25 NOTE — Telephone Encounter (Signed)
Annabelle HarmanDana, move his NCS/EMG the same day as potential skin biopsy. Reschedule his EMG/NCS is needed.

## 2013-05-25 NOTE — Telephone Encounter (Signed)
Dr.Yan I have a skin Biopsy Kit on the way scheduled for 05/30/2013. CX his ncv/emg ? He is only getting skin biopsy for Now.

## 2013-05-28 ENCOUNTER — Other Ambulatory Visit: Payer: Self-pay | Admitting: Neurology

## 2013-05-30 ENCOUNTER — Encounter (INDEPENDENT_AMBULATORY_CARE_PROVIDER_SITE_OTHER): Payer: Self-pay

## 2013-05-30 ENCOUNTER — Ambulatory Visit (INDEPENDENT_AMBULATORY_CARE_PROVIDER_SITE_OTHER): Payer: BC Managed Care – PPO | Admitting: Neurology

## 2013-05-30 ENCOUNTER — Telehealth: Payer: Self-pay | Admitting: Neurology

## 2013-05-30 ENCOUNTER — Encounter: Payer: Self-pay | Admitting: Neurology

## 2013-05-30 DIAGNOSIS — E1142 Type 2 diabetes mellitus with diabetic polyneuropathy: Secondary | ICD-10-CM

## 2013-05-30 DIAGNOSIS — G903 Multi-system degeneration of the autonomic nervous system: Secondary | ICD-10-CM

## 2013-05-30 DIAGNOSIS — G629 Polyneuropathy, unspecified: Secondary | ICD-10-CM

## 2013-05-30 DIAGNOSIS — G608 Other hereditary and idiopathic neuropathies: Secondary | ICD-10-CM

## 2013-05-30 DIAGNOSIS — Z0289 Encounter for other administrative examinations: Secondary | ICD-10-CM

## 2013-05-30 DIAGNOSIS — I951 Orthostatic hypotension: Secondary | ICD-10-CM

## 2013-05-30 DIAGNOSIS — G589 Mononeuropathy, unspecified: Secondary | ICD-10-CM

## 2013-05-30 DIAGNOSIS — R209 Unspecified disturbances of skin sensation: Secondary | ICD-10-CM

## 2013-05-30 NOTE — Procedures (Signed)
     HISTORY:  Leroy Williams is a 53 year old gentleman with a two-year history of numbness in the feet and lower legs almost up to the knees. Within the last 2 or 3 months, the patient has had similar symptoms on the hands. The patient denies any neck or low back pain, or pain radiating down the arms or legs. The patient denies any issues controlling the bowels or the bladder, but he does have some dizziness with standing. The patient is being evaluated for possible peripheral neuropathy.  NERVE CONDUCTION STUDIES:  Nerve conduction studies were performed on both lower extremities. The distal motor latencies and motor amplitudes for the peroneal and posterior tibial nerves were within normal limits. The nerve conduction velocities for these nerves were also normal. The H reflex latencies were normal. The sensory latencies for the peroneal nerves were within normal limits.   EMG STUDIES:  EMG study was performed on the right lower extremity:  The tibialis anterior muscle reveals 2 to 4K motor units with full recruitment. No fibrillations or positive waves were seen. The peroneus tertius muscle reveals 2 to 4K motor units with full recruitment. No fibrillations or positive waves were seen. The medial gastrocnemius muscle reveals 1 to 3K motor units with full recruitment. No fibrillations or positive waves were seen. The vastus lateralis muscle reveals 2 to 4K motor units with full recruitment. No fibrillations or positive waves were seen. The iliopsoas muscle reveals 2 to 4K motor units with full recruitment. No fibrillations or positive waves were seen. The biceps femoris muscle (long head) reveals 2 to 4K motor units with full recruitment. No fibrillations or positive waves were seen. The lumbosacral paraspinal muscles were tested at 3 levels, and revealed no abnormalities of insertional activity at all 3 levels tested. There was good relaxation.   IMPRESSION:  Nerve conduction studies done  on both lower extremities were within normal limits bilaterally. No clear evidence of a peripheral neuropathy is noted. A small fiber neuropathy however, may be missed by a standard nerve conduction studies. Clinical correlation is required. EMG evaluation of the right lower extremity was within normal limits. There is no evidence of an overlying right lumbosacral radiculopathy.  Marlan Palau. Keith Willis MD 05/30/2013 10:33 AM  [facility] [address] [place], [place] [id number]  Phone [phone] Fax [phone]

## 2013-05-30 NOTE — Telephone Encounter (Signed)
Patient was to be referred to Dr. Terrace ArabiaYan for skin biopsy but has never been scheduled--also patient needs [phone]--

## 2013-05-30 NOTE — Progress Notes (Signed)
Ms. Severiano Gilberteele is a 53 years old gentleman, with diet-controlled diabetes for 2 years, A1c 6.0, presenting with gradual onset bilateral feet paresthesia, starting at his toes, now ascending to his mid shin level, electrodiagnostic study was normal, there is no evidence of large fiber peripheral neuropathy  Patient was in right lateral recombinant position. Sterile technique. 1% lidocaine with epinephrine was used for local anesthesia. Punctuated skin biopsy was performed. 3 mm skin sample were obtained at at left foot, above left extensor digitorum brevis, and left lateral calf, 10 cm above lateral malleolus. Right thigh, 20cm below left superior iliac spine. Patient tolerated the procedure well.  The wound was covered with neosporin antibiotic cream and bandage.

## 2013-05-30 NOTE — Telephone Encounter (Signed)
Called and spoke to patient he didn't realize he was suppose to have skin biopsy. I called patient  And he is going to come back to office today. And Dr.Yan will do skin biopsy.

## 2013-06-02 ENCOUNTER — Other Ambulatory Visit: Payer: BC Managed Care – PPO

## 2013-06-03 NOTE — Progress Notes (Signed)
Quick Note:  Spoke to patient and relayed normal protein electrophoresis, per Dr. Vickey Hugerohmeier. ______

## 2013-06-06 NOTE — Progress Notes (Signed)
Quick Note:  Shared normal results with patient per Dr Oliva Bustardohmeier's findings, he verbalized understanding and is waiting for the results of the skin biopsy ______

## 2013-06-08 ENCOUNTER — Telehealth: Payer: Self-pay | Admitting: *Deleted

## 2013-06-08 ENCOUNTER — Ambulatory Visit (INDEPENDENT_AMBULATORY_CARE_PROVIDER_SITE_OTHER): Payer: BC Managed Care – PPO

## 2013-06-08 NOTE — Telephone Encounter (Signed)
Pt here for MRI, not able to complete due to claustrophobia.  Looking on medlist,  Pt takes valium 5mg  prn.  He will try taking this 1hour before test, and see if this will get him thru.  Needs driver.

## 2013-06-09 ENCOUNTER — Other Ambulatory Visit: Payer: BC Managed Care – PPO

## 2013-06-16 DIAGNOSIS — R209 Unspecified disturbances of skin sensation: Secondary | ICD-10-CM

## 2013-06-17 ENCOUNTER — Telehealth: Payer: Self-pay | Admitting: Neurology

## 2013-06-17 NOTE — Telephone Encounter (Signed)
Pt called. He is a Dohmeier Pt but the procedure he had done was completed by Dr. Terrace ArabiaYan. Dr. Terrace ArabiaYan completed a skin biopsy for the Pt and he was wanting to see if he could get the results from that.  He also stated that he completed his MRI on 06-15-13 and would like to ask if he could get the results from that as well.  Please call as necessary.  Thank you.

## 2013-06-17 NOTE — Telephone Encounter (Signed)
Called and informed patient that both results are not ready as of yet, informed that once it has been reviewed by physician,patient will be call with results

## 2013-06-23 ENCOUNTER — Telehealth: Payer: Self-pay | Admitting: Neurology

## 2013-06-23 NOTE — Telephone Encounter (Signed)
Pt called pt is a Dr. Oliva Bustardohmeier's pt but the procedure (a skin biopsy)complete by Dr. Terrace ArabiaYan. Pt would like for Dr. Terrace ArabiaYan or nurse to c/b concerning the results from this test. He also stated that he completed his MRI on 06-15-13 and would like to ask if he could get the results from that as well.

## 2013-06-27 ENCOUNTER — Telehealth: Payer: Self-pay | Admitting: Neurology

## 2013-06-27 NOTE — Telephone Encounter (Signed)
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 Vickey Hugerohmeier as previously scheduled in June 20 third.

## 2013-06-27 NOTE — Telephone Encounter (Signed)
Results not in as of yet, called [facility], will fax over report, will give to Dr  Terrace ArabiaYan for review,would also like results of his MRI completed on 06/16/13,informed patient,wanted to speak with Nurse Lupita Leash(Donna), transferred call to her.

## 2013-06-27 NOTE — Telephone Encounter (Signed)
I spoke to patient and relayed that the doctor has the skin biopsy results and will call him with those results.  He also wanted the results of his MRI brain, which I explained would have to be read by the doctor.

## 2013-06-27 NOTE — Telephone Encounter (Signed)
Patient calling again regarding MRI and skin biopsy results--please call.

## 2013-06-28 ENCOUNTER — Telehealth: Payer: Self-pay | Admitting: Neurology

## 2013-06-28 ENCOUNTER — Encounter: Payer: Self-pay | Admitting: Neurology

## 2013-06-28 NOTE — Telephone Encounter (Signed)
Patient was called with results on 06-27-13.

## 2013-06-28 NOTE — Telephone Encounter (Signed)
Scheduled/confirmed patient for sooner appt

## 2013-06-28 NOTE — Telephone Encounter (Signed)
Pt called in requesting an appointment with Dr. Vickey Hugerohmeier as soon as possible.  He states that he just received the result of his skin biopsy as well as his MRI results and would like to go over them with her.  He also states that his neuropathy is getting much worse and he is in a lot of pain.  Currently has appointment scheduled for June but doesn't feel he can wait that long.  Please call. Thank you

## 2013-06-30 DIAGNOSIS — K6289 Other specified diseases of anus and rectum: Secondary | ICD-10-CM | POA: Insufficient documentation

## 2013-07-15 ENCOUNTER — Other Ambulatory Visit: Payer: Self-pay | Admitting: Pain Medicine

## 2013-07-15 DIAGNOSIS — M545 Low back pain, unspecified: Secondary | ICD-10-CM

## 2013-07-15 DIAGNOSIS — M533 Sacrococcygeal disorders, not elsewhere classified: Secondary | ICD-10-CM

## 2013-07-18 DIAGNOSIS — K649 Unspecified hemorrhoids: Secondary | ICD-10-CM | POA: Insufficient documentation

## 2013-07-18 NOTE — Progress Notes (Signed)
Quick Note:  Spoke to patient and relayed about baby aspirin and multivitamin. Patient is currently taking Aspirin 81 mg. I told him to relay that on his next visit as we don't have it listed on his medication list. Dr. Terrace ArabiaYan had called patient with MRI brain results. ______

## 2013-07-20 ENCOUNTER — Other Ambulatory Visit: Payer: Self-pay

## 2013-07-20 ENCOUNTER — Ambulatory Visit
Admission: RE | Admit: 2013-07-20 | Discharge: 2013-07-20 | Disposition: A | Discharge status: Home / Self Care | Payer: BC Managed Care – PPO | Source: Ambulatory Visit | Attending: Pain Medicine | Admitting: Pain Medicine

## 2013-07-20 DIAGNOSIS — M545 Low back pain, unspecified: Secondary | ICD-10-CM

## 2013-07-20 DIAGNOSIS — M533 Sacrococcygeal disorders, not elsewhere classified: Secondary | ICD-10-CM

## 2013-07-21 ENCOUNTER — Ambulatory Visit (INDEPENDENT_AMBULATORY_CARE_PROVIDER_SITE_OTHER): Payer: BC Managed Care – PPO | Admitting: Neurology

## 2013-07-21 ENCOUNTER — Encounter: Payer: Self-pay | Admitting: Neurology

## 2013-07-21 VITALS — BP 120/76 | HR 74 | Ht 72.0 in | Wt 261.0 lb

## 2013-07-21 DIAGNOSIS — G629 Polyneuropathy, unspecified: Secondary | ICD-10-CM

## 2013-07-21 DIAGNOSIS — G609 Hereditary and idiopathic neuropathy, unspecified: Secondary | ICD-10-CM

## 2013-07-21 HISTORY — DX: Polyneuropathy, unspecified: G62.9

## 2013-07-21 MED ORDER — PREGABALIN 100 MG PO CAPS: 100.0000 mg | ORAL_CAPSULE | Freq: Three times a day (TID) | ORAL | Status: DC

## 2013-07-21 MED ORDER — L-METHYLFOLATE-B6-B12 3-35-2 MG PO TABS: 1.0000 | ORAL_TABLET | Freq: Two times a day (BID) | ORAL | Status: DC

## 2013-07-21 NOTE — Progress Notes (Signed)
[facility]  Provider:  Melvyn Novasarmen  Dawt Reeb, M D  Referring Provider: Jim LikeWilson, Benjamin, MD Primary Care Physician:  Jim LikeWILSON,BENJAMIN, MD    HPI:  Leroy Williams is a 53 y.o. male  Is seen here as a  revisit  from Dr. Andrey CampanileWilson for a skin biopsy  follow up . He is diabetic and obese.   Leroy Williams has been doing well with the treatment of his sleep apnea but unfortunately developed a progressive neuropathy of which the baseline was already known. 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 In the cold, his headache is improved, he doesn't sweat as he does in warm weather.  His feet have been very cold and in excruciating pain, his hands do not turn blue. He feels he sweats too much, is heat intolerant and changes  shirts every 6 hours.   He has dry mouth and eyes, and I noted a large tongue. In the morning he feels dysarthric, clumsy tongue.  He has no known arsenic, lead or thallium exposure.   This neuropathy  forces him to pace the whole at night and he has been less able to get restful sleep- thus reducing sleep time on CPAP  as well.   The patient underwent on 05-30-13 a skin biopsy with Dr. Terrace ArabiaYan. on 3 locations on the left lower extremity , of which the  foot and mid calf level documented a reduced small fiber density.  At the thigh level , a normal nerve fiber density was found.  This confirms a small fiber neuropathy and also fits with clinical symptoms of pain in the distal peripheral distribution, and of dysautonomia. 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   He reports that his primary pain management clinic had already tried him on mentholated vitamin B12 and I advised him today that we should combine this either with Neurontin of which he has taken an extended release form, or with  Lyrica. Please review Dr. Zannie CoveYan's note from 02l-23-15.  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 No impingement. The patient brought the CD ROM of these films from 07-15-13.   OSA:  His compliance is unfortunately down to 55% but the CPAP is used. hiis AHI is reduced from 39.2 3.8 apneas per hour.  Based on this I strongly recommend for him to continue using it as much as possible his CPAP is set at 10 cm water and the download report was made available to us on 04-18-13 by [employer],  his durable medical equipment company.     HPI: First seen in  consult on 04-19-12. The  Patient follows a local pain clinic and was referred for an increase in migraine headaches ,which [facility] Dr. Lilian KapurMcDonald is not longer at the headache clinic.  When he first saw me on 04/21/2012, he was in need of having his beta blocker refilled and  he rarely uses Percocet for migraine headaches.  He has described that his right eye is mostly affected with his headaches, but he also noticed flushing and tearing but the headache. SUNCT. During the evaluation he reported that he had been diagnosed with obstructive sleep apnea 3 years earlier but was unable to use his CPAP machine which was relatively no new. He feels his medication but also ordered a split night polysomnography for the patient. Date of the study was 13-Jan -2014 . The AHI for the diagnostic part of the study was 39.2,  the RDI was 46.4 , the oxygen desaturation was  lowest at 81%,  with a total of 94.8 minutes at night in desaturation time.  The patient had a normal sinus rhythm.  He was then titrated to CPAP pressure of 10 cm  H20, and there was a significant  reduction of the AHI to 0.0.  Sleep latency on CPAP was 4.5 minutes under CPAP he does take 20.3% slow-wave sleep and 24.9% REM sleep.  There was still desaturation for 93.4 minutes while on CPAP. The patient carries no pulmonary primary diagnosis and it  is very well possible that his oxygen desaturations contribute meanly to his headaches. The patient owns his own machine which was to be reset  to 10 cm water pressure with an EPR of 2 cm water. .Patient past medical history of diabetes, neuropathy, GERD, back pain, cholesterolemia, testosterone deficiency, kidney stones, migraine headaches, deviated nasal septum and rhinitis status post post rhinoplasty.   Restless leg syndrome and normal obstructive sleep apnea. When he was first seen he had excessive daytime sleepiness and endorsed the Epworth sleepiness score at 19 points of 24 reported snoring bruxism, kicking and jerking of limbs and legs at night and excessive sweating during this during sleep. He gets better better control of his headaches, which means that he not longer has the headaches in the morning. No hypnic headches, and not a CO2 retainer.  Headaches arise at or after lunch time and increase in intensity over the hours up to 5 Pm , when they peak. Usually takes Excedrin at that time, and he takes medication for his painful neuropathy.   Review of Systems: Out of a complete 14 system review, the patient complains of only the following symptoms, and all other reviewed systems are negative.  Epworth is not obtained today,    Rhinitis  Allergic.  FSS high , Epworth high, GDS high.    Current Outpatient Prescriptions  Medication Sig Dispense Refill  . buPROPion (WELLBUTRIN XL) 300 MG 24 hr tablet       . diazepam (VALIUM) 5 MG tablet       . DULoxetine (CYMBALTA) 60 MG capsule       . dutasteride (AVODART) 0.5 MG  capsule Take 0.5 mg by mouth daily.      . metoprolol succinate (TOPROL-XL) 25 MG 24 hr tablet TAKE 1 TABLET AT LUNCH AND 1 TABLET EACH NIGHT  180 tablet  3  . NONFORMULARY OR COMPOUNDED ITEM 6 a plus  tramadol . Transdermal therapeutics.  240 each  5  . orphenadrine (NORFLEX) 100 MG tablet Take 1 tablet (100 mg total) by mouth 2 (two) times daily as needed for muscle spasms.  60 tablet  5  . oxyCODONE-acetaminophen (PERCOCET/ROXICET) 5-325 MG per tablet       . OXYCONTIN 10 MG T12A 12 hr tablet 10 mg daily.      . potassium citrate (UROCIT-K) 10 MEQ (1080 MG) SR  tablet       . pramipexole (MIRAPEX) 0.5 MG tablet Take 1 tablet (0.5 mg total) by mouth 3 (three) times daily.  60 tablet  5  . pravastatin (PRAVACHOL) 40 MG tablet Take 40 mg by mouth Nightly. One each night      . PRILOSEC OTC 20 MG tablet       . promethazine (PHENERGAN) 50 MG tablet Take 1 tablet (50 mg total) by mouth every 6 (six) hours as needed for nausea or vomiting.  30 tablet  3  . tamsulosin (FLOMAX) 0.4 MG CAPS       . testosterone cypionate (DEPOTESTOTERONE CYPIONATE) 200 MG/ML injection every 14 (fourteen) days. Injection      . tiZANidine (ZANAFLEX) 4 MG capsule TAKE 1 CAPSULE (4 MG TOTAL) BY MOUTH 3 (THREE) TIMES DAILY AS NEEDED FOR MUSCLE SPASMS.  90 capsule  0  . traZODone (DESYREL) 50 MG tablet       . l-methylfolate-B6-B12 (METANX) 3-35-2 MG TABS Take 1 tablet by mouth 2 (two) times daily between meals.  180 tablet  3  . pregabalin (LYRICA) 100 MG capsule Take 1 capsule (100 mg total) by mouth 3 (three) times daily.  270 capsule  1   No current facility-administered medications for this visit.    Allergies as of 07/21/2013 - Review Complete 07/21/2013  Allergen Reaction Noted  . Codeine Itching 02/23/2013  . Ivp dye [iodinated diagnostic agents] Other (See Comments) 02/23/2013    Vitals: BP 120/76  Pulse 74  Ht 6' (1.829 m)  Wt 261 lb (118.389 kg)  BMI 35.39 kg/m2 Last Weight:  Wt Readings from Last 1  Encounters:  07/21/13 261 lb (118.389 kg)   Last Height:   Ht Readings from Last 1 Encounters:  07/21/13 6' (1.829 m)   Vision Screening:   Physical exam:  General: The patient is awake, alert and appears  in acute pain/  distress. The patient is well groomed. Head: Normocephalic, atraumatic. Neck is supple. Mallampati 3 , neck circumference: Cardiovascular:  Regular rate and rhythm, without  murmurs or carotid bruit, and without distended neck veins. Respiratory: Lungs are clear to auscultation. Skin:  Without evidence of edema, or rash Trunk: BMI iselevated and patient  has normal posture.  Neurologic exam : The patient is awake and alert, oriented to place and time.  Memory subjective  described as intact. There is a normal attention span & concentration ability.  Speech is fluent without  dysarthria, dysphonia or aphasia. Mood and affect are depressed.  Cranial nerves: Pupils are equal and briskly reactive to light. Funduscopic exam without  evidence of pallor or edema-  Again, difficult to tolerate for this Headache patient.   Extraocular movements  in vertical and horizontal planes intact and without nystagmus. Visual fields by finger perimetry are intact. Right ptosis. He had botox on that side.  Hearing to finger rub intact.  Facial sensation intact to fine touch.  Facial motor strength is symmetric and tongue and uvula move midline. There is flushing over the face on both sides.   Motor exam:   Normal tone and normal muscle bulk and symmetric strength in all extremities.  Sensory:  Fine touch, pinprick and vibration were tested in all extremities. Proprioception is tested in the upper extremities only. This was  normal.  Coordination: Rapid alternating movements in the fingers/hands is tested and normal. Finger-to-nose maneuver without evidence of ataxia, dysmetria or tremor.  Gait and station: Patient walks without assistive device , climbs up  to the exam table without  assistence .Tandem gait is unfragmented.  Deep tendon reflexes: in the  upper and lower extremities are symmetric and intact. Babinski maneuver response is downgoing.   Assessment:  After physical and neurologic examination, review of laboratory studies, imaging, neurophysiology testing and pre-existing records,  0) OSA -  established compliance in this patient with a new  downloadable  BiPAP machine and reported spotty compliance.  He is in pain and paces his house at night , he reports, but even 3 hours of use have had an affect on his Epworth.  1)Headache: chronic severe headaches - primary headaches. I reviewed the letter form his pain clinic, I will be happy to prescribe the topical compound for the patient, and this time add clonidine to the Prescription.  2) small fiber sensory and autonomic neuropathy : burning skin , peripheral neuropathy, developing over the last 3 years. Skin biopsy confirmed the low density of nerve fibers, small fiber neuropathy and loss of sweat glands- dysautonomia.  No iGg peak on serum phoresis.   Feels not longer help from [place] , not from  compound - pharmaceuticals . Will try Lyrica with metanx.      Plan:  Treatment plan and additional workup as above .      Patient is advised to use CPAP daily and establish a routine. Compliance  visits yearly. Driving restrictions apply.  Severe hemicrania and facial pain , controlled under beat blockers except during allergy season.  Ondansetron for  Nausea , orphenadrine tabs continued. Percocet for pain  Emergencies. Trial of Lyrica with Metanx for neuropathy.     Review of Systems: Out of a complete 14 system review, the patient complains of only the following symptoms, and all other reviewed systems are negative. Back and hip pain on the right , foot pain ( plantar faciitis ) and neuropathy.

## 2013-07-21 NOTE — Patient Instructions (Signed)

## 2013-08-24 ENCOUNTER — Ambulatory Visit: Payer: BC Managed Care – PPO | Admitting: Neurology

## 2013-08-26 ENCOUNTER — Other Ambulatory Visit: Payer: Self-pay | Admitting: Neurology

## 2013-09-27 ENCOUNTER — Ambulatory Visit: Payer: BC Managed Care – PPO | Admitting: Neurology

## 2013-10-03 HISTORY — PX: KIDNEY STONE SURGERY: SHX686

## 2013-10-04 DIAGNOSIS — N200 Calculus of kidney: Secondary | ICD-10-CM | POA: Insufficient documentation

## 2014-01-14 ENCOUNTER — Other Ambulatory Visit: Payer: Self-pay | Admitting: Neurology

## 2014-01-20 ENCOUNTER — Ambulatory Visit: Payer: BC Managed Care – PPO | Admitting: Nurse Practitioner

## 2014-01-24 ENCOUNTER — Ambulatory Visit: Payer: BC Managed Care – PPO | Admitting: Nurse Practitioner

## 2014-01-30 ENCOUNTER — Ambulatory Visit (INDEPENDENT_AMBULATORY_CARE_PROVIDER_SITE_OTHER): Payer: BC Managed Care – PPO | Admitting: Nurse Practitioner

## 2014-01-30 ENCOUNTER — Encounter: Payer: Self-pay | Admitting: Nurse Practitioner

## 2014-01-30 VITALS — BP 134/93 | HR 68 | Ht 71.0 in | Wt 263.0 lb

## 2014-01-30 DIAGNOSIS — G629 Polyneuropathy, unspecified: Secondary | ICD-10-CM

## 2014-01-30 DIAGNOSIS — G603 Idiopathic progressive neuropathy: Secondary | ICD-10-CM

## 2014-01-30 DIAGNOSIS — G4733 Obstructive sleep apnea (adult) (pediatric): Secondary | ICD-10-CM

## 2014-01-30 DIAGNOSIS — G608 Other hereditary and idiopathic neuropathies: Secondary | ICD-10-CM

## 2014-01-30 DIAGNOSIS — Z9989 Dependence on other enabling machines and devices: Principal | ICD-10-CM

## 2014-01-30 MED ORDER — GABAPENTIN 600 MG PO TABS: ORAL_TABLET | ORAL | Status: AC

## 2014-01-30 NOTE — Progress Notes (Signed)
PATIENT: Leroy Williams DOB: 08/06/1960  REASON FOR VISIT: routine follow up for OSA on cpap, neuropathy HISTORY FROM: patient and wife  HISTORY OF PRESENT ILLNESS: Leroy Williams is a 53 y.o. male Is seen here as a revisit for OSA, for cpap compliance.  His download today was not obtained - DME is [employer]. He did not bring his cpap machine with him and he states he is not using it due to the hassle of putting it on and off since he gets up frequently at night due to neuropathy pain.  He takes medication for his painful neuropathy including Oxycontin and Dilaudid from a pain clinic. He has tried topical preparations with no lasting benefit.  He is a patient at [facility].  He did not find benefit of Lyrica over Gralise. He filled the Metanx prescription but states it was too expensive, not sure if helpful, so he may try to take vit B supplements to mimic it. He had another kidney stone surgery in June. He states that headaches are the best controlled they have been in 15 years.   Last visit, July 21, 2013 (CD): Leroy Williams has been doing well with the treatment of his sleep apnea but unfortunately developed a progressive neuropathy of which the baseline was already known. 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 In the cold, his headache is improved, he doesn't sweat as he does in warm weather. His feet have been very cold and in excruciating pain, his hands do not turn blue. He feels he sweats too much, is heat intolerant and changes shirts every 6 hours.  He has dry mouth and eyes, and I noted a large tongue. In the morning he feels  dysarthric, clumsy tongue.  He has no known arsenic, lead or thallium exposure.  This neuropathy forces him to pace the whole at night and he has been less able to get restful sleep- thus reducing sleep time on CPAP as well.  The patient underwent on 05-30-13 a skin biopsy with Dr. Terrace ArabiaYan. on 3 locations on the left lower extremity , of which the foot and mid calf level documented a reduced small fiber density.  At the thigh level, a normal nerve fiber density was found.  This confirms a small fiber neuropathy and also fits with clinical symptoms of pain in the distal peripheral distribution, and of dysautonomia. 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  He reports that his primary pain management clinic had already tried him on mentholated vitamin B12 and I advised him today that we should combine this either with Neurontin of which he has taken an extended release form, or with Lyrica. Please review Dr. Zannie CoveYan's note from 02l-23-15.  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 No impingement. The patient brought the CD ROM of these films from 07-15-13.  OSA:  His compliance is unfortunately down to 55% but the CPAP is used. his  AHI is reduced from 39.2 3.8 apneas per hour.  Based on this I strongly recommend for him to continue using it as much as possible his CPAP is set at 10 cm water and the download report was made available to us on 04-18-13 by [employer], his durable medical equipment company.   HPI: First seen in consult on 04-19-12. The Patient follows a local pain clinic and was referred for an increase in migraine headaches, which were formerly treated by [facility] Dr. Lilian KapurMcDonald is not longer at the headache clinic.  When he first saw me on 04/21/2012, he was in  need of having his beta blocker refilled and he rarely uses Percocet for migraine headaches.  He has described that his right eye is mostly affected with his headaches, but he also noticed flushing and tearing but the headache. SUNCT.  During the evaluation he reported that he had been diagnosed with obstructive sleep apnea 3 years earlier but was unable to use his CPAP machine which was relatively no new. He feels his medication but also ordered a split night polysomnography for the patient. Date of the study was 13-Jan -2014. The AHI for the diagnostic part of the study was 39.2, the RDI was 46.4. The oxygen desaturation was lowest at 81%, with a total of 94.8 minutes at night in desaturation time. The patient had a normal sinus rhythm.  He was then titrated to CPAP pressure of 10 cm H20, and there was a significant reduction of the AHI to 0.0.  Sleep latency on CPAP was 4.5 minutes under CPAP he does take 20.3% slow-wave sleep and 24.9% REM sleep.  There was still desaturation for 93.4 minutes while on CPAP. The patient carries no pulmonary primary diagnosis and it is very well possible that his oxygen desaturations contribute meanly to his headaches. The patient owns his own machine which was to be reset to 10 cm water pressure with an EPR of 2 cm water. .Patient past medical history of diabetes, neuropathy, GERD, back pain, cholesterolemia, testosterone deficiency, kidney stones, migraine headaches, deviated nasal septum and rhinitis status post post rhinoplasty.  Restless leg syndrome and normal obstructive sleep apnea. When he was first seen he had excessive daytime sleepiness and endorsed the Epworth sleepiness score at 19 points of 24 reported snoring bruxism, kicking and jerking of limbs and legs at night and excessive sweating during this during sleep.  He gets better better control of his headaches, which means that he not longer has the headaches in the morning. No hypnic headches, and not a CO2  retainer.  Headaches arise at or after lunch time and increase in intensity over the hours up to 5 Pm, when they peak. Usually takes Excedrin at that time, and he takes medication for his painful neuropathy.   REVIEW OF SYSTEMS: Full 14 system review of systems performed and notable only for:  Activity change, fatigue, excessive sweating, ringing in ears, wheezing, shortness of breath, heat intolerance, excessive eating, flushing, nausea, restless leg, apnea, frequent waking, daytime sleepiness, snoring, sleep talking, difficulty urinating, painful urination, joint pain, joint swelling, back pain, aching muscles, walking difficulty, moles, dizziness, headache, numbness, agitation, decreased concentration, depression, anxiety.   ALLERGIES: Allergies  Allergen Reactions  . Codeine Itching  . Ivp Dye [Iodinated Diagnostic Agents] Other (See Comments)    Chest pains    HOME MEDICATIONS: Outpatient Prescriptions Prior to Visit  Medication Sig Dispense Refill  . buPROPion (WELLBUTRIN XL) 300 MG 24 hr tablet       .  diazepam (VALIUM) 5 MG tablet Take 5 mg by mouth as needed.       . DULoxetine (CYMBALTA) 60 MG capsule Take 60 mg by mouth 2 (two) times daily.       Marland Kitchen l-methylfolate-B6-B12 (METANX) 3-35-2 MG TABS Take 1 tablet by mouth 2 (two) times daily between meals.  180 tablet  3  . metoprolol succinate (TOPROL-XL) 25 MG 24 hr tablet TAKE 1 TABLET AT LUNCH AND 1 TABLET EACH NIGHT  180 tablet  3  . orphenadrine (NORFLEX) 100 MG tablet Take 1 tablet (100 mg total) by mouth 2 (two) times daily as needed for muscle spasms.  60 tablet  5  . oxyCODONE-acetaminophen (PERCOCET/ROXICET) 5-325 MG per tablet Take by mouth every 6 (six) hours as needed.       . OXYCONTIN 10 MG T12A 12 hr tablet 10 mg daily.      . potassium citrate (UROCIT-K) 10 MEQ (1080 MG) SR tablet       . pramipexole (MIRAPEX) 0.5 MG tablet Take 1 tablet (0.5 mg total) by mouth 3 (three) times daily.  60 tablet  5  . pravastatin  (PRAVACHOL) 40 MG tablet Take 40 mg by mouth Nightly. One each night      . PRILOSEC OTC 20 MG tablet       . promethazine (PHENERGAN) 50 MG tablet TAKE 1 TABLET (50 MG TOTAL) BY MOUTH EVERY 6 (SIX) HOURS AS NEEDED FOR NAUSEA OR VOMITING.  30 tablet  3  . tamsulosin (FLOMAX) 0.4 MG CAPS       . testosterone cypionate (DEPOTESTOTERONE CYPIONATE) 200 MG/ML injection every 14 (fourteen) days. Injection      . tiZANidine (ZANAFLEX) 4 MG capsule TAKE 1 CAPSULE (4 MG TOTAL) BY MOUTH 3 (THREE) TIMES DAILY AS NEEDED FOR MUSCLE SPASMS.  90 capsule  6  . traZODone (DESYREL) 50 MG tablet 50 mg at bedtime as needed.       . pregabalin (LYRICA) 100 MG capsule Take 1 capsule (100 mg total) by mouth 3 (three) times daily.  270 capsule  1  . dutasteride (AVODART) 0.5 MG capsule Take 0.5 mg by mouth daily.      . NONFORMULARY OR COMPOUNDED ITEM 6 a plus  tramadol . Transdermal therapeutics.  240 each  5   No facility-administered medications prior to visit.    PHYSICAL EXAM Filed Vitals:   01/30/14 0901  BP: 134/93  Pulse: 68  Height: 5\' 11"  (1.803 m)  Weight: 263 lb (119.296 kg)   Body mass index is 36.7 kg/(m^2).  Physical exam:  General: The patient is awake, alert and appears in acute pain/ distress. The patient is well groomed.  Head: Normocephalic, atraumatic. Neck is supple. Mallampati 3, neck circumference:  Cardiovascular: Regular rate and rhythm, without murmurs or carotid bruit, and without distended neck veins.  Respiratory: Lungs are clear to auscultation.  Skin: Without evidence of edema, or rash  Trunk: BMI iselevated and patient has normal posture.   Neurologic exam :  The patient is awake and alert, oriented to place and time. Memory subjective described as intact. There is a normal attention span & concentration ability.  Speech is fluent without dysarthria, dysphonia or aphasia. Mood and affect are depressed.  Cranial nerves:  Pupils are equal and briskly reactive to light.  Funduscopic exam without evidence of pallor or edema- Again, difficult to tolerate for this Headache patient.  Extraocular movements in vertical and horizontal planes intact and without nystagmus. Visual fields by finger  perimetry are intact. Right ptosis. He had botox on that side.  Hearing to finger rub intact. Facial sensation intact to fine touch.  Facial motor strength is symmetric and tongue and uvula move midline. There is flushing over the face on both sides.  Motor exam: Normal tone and normal muscle bulk and symmetric strength in all extremities.  Sensory: Fine touch, pinprick and vibration were tested in all extremities. Proprioception is tested in the upper extremities only. This was normal.  Coordination: Rapid alternating movements in the fingers/hands is tested and normal. Finger-to-nose maneuver without evidence of ataxia, dysmetria or tremor.  Gait and station: Patient walks without assistive device , climbs up to the exam table without assistence .Tandem gait is unfragmented.  Deep tendon reflexes: in the upper and lower extremities are symmetric and intact. Babinski maneuver response is downgoing.   Assessment: After physical and neurologic examination, review of laboratory studies, imaging, neurophysiology testing and pre-existing records,  1) OSA - established compliance in this patient with a new downloadable BiPAP machine and reported poor compliance. He is in pain and paces his house at night, he reports. 2) Headache: chronic severe headaches - primary headaches. 3) small fiber sensory and autonomic neuropathy : burning skin, peripheral neuropathy, developing over the last 3 years. Skin biopsy confirmed the low density of nerve fibers, small fiber neuropathy and loss of sweat glands- dysautonomia. No iGg peak on serum phoresis.   Plan: Treatment plan and additional workup as above.  Patient is advised to try to use CPAP daily and establish a routine.  Severe hemicrania and  facial pain, controlled under beat blockers except during allergy season. Ondansetron for Nausea, orphenadrine tabs continued.  For neuropathy - He did not find much benefit of Lyrica or Metanx. He is looking for ways to cut costs. Offered to go back to generic Gabapentin 600 mg QID, he wants to do this. Also he weaned back Cymbalta and found that it was really helping with his burning symptoms so he went back to previous bid dose. He is also on Oxycontin and Dilaudid per pain clinic. Follow up in 6 months with Dr. Vickey Huger.  Meds ordered this encounter  Medications  . gabapentin (NEURONTIN) 600 MG tablet    Sig: Take four times daily.    Dispense:  120 tablet    Refill:  5    Order Specific Question:  Supervising Provider    Answer:  Vickey Huger, CARMEN [[id number]]   Return in about 6 months (around 08/01/2014) for neuropathy.  Tawny Asal Jamia Hoban, MSN, FNP-BC, A/GNP-C 01/30/2014, 11:18 AM [facility] [address] [place], [place] [address] [phone]  Note: This document was prepared with digital dictation and possible smart phrase technology. Any transcriptional errors that result from this process are unintentional.

## 2014-01-30 NOTE — Patient Instructions (Addendum)
We will switch you from Lyrica to Gabapentin 600 mg 4 times daily for cost savings.  You may use up your Lyrica and double up on the dose, 200 mg 3 times daily to see if it helps.  Follow up with the Pain clinic for your spine care.  Please continue using your CPAP regularly. While your insurance requires that you use CPAP at least 4 hours each night on 70% of the nights, I recommend, that you not skip any nights and use it throughout the night if you can. Getting used to CPAP and staying with the treatment long term does take time and patience and discipline. Untreated obstructive sleep apnea when it is moderate to severe can have an adverse impact on cardiovascular health and raise her risk for heart disease, arrhythmias, hypertension, congestive heart failure, stroke and diabetes. Untreated obstructive sleep apnea causes sleep disruption, nonrestorative sleep, and sleep deprivation. This can have an impact on your day to day functioning and cause daytime sleepiness and impairment of cognitive function, memory loss, mood disturbance, and problems focussing. Using CPAP regularly can improve these symptoms.  Return for follow up with Dr. Vickey Hugerohmeier in 6 months, sooner as needed.

## 2014-01-30 NOTE — Progress Notes (Signed)
I agree with the assessment and plan as directed by NP .The patient is known to me .   Esterlene Atiyeh, MD  

## 2014-04-05 ENCOUNTER — Telehealth: Payer: Self-pay | Admitting: Neurology

## 2014-04-05 NOTE — Telephone Encounter (Signed)
Patient stated Leroy Williams at last OV on 10/26, switch Rx from pregabalin (LYRICA) 100 MG  to gabapentin (NEURONTIN) 600 MG tablet.  Patient stated gabapentin hasn't helped with pain and would like to switch back to Lyrica.  Requesting Rx for Lyrica forwarded to [facility] # [phone] in [place].  Please call [phone] first and if not available call [phone].  Patient leaving for the beach today and would like this taken care of before he leaves.

## 2014-04-06 ENCOUNTER — Other Ambulatory Visit: Payer: Self-pay | Admitting: Neurology

## 2014-04-13 MED ORDER — PREGABALIN 100 MG PO CAPS: 100.0000 mg | ORAL_CAPSULE | Freq: Three times a day (TID) | ORAL | Status: AC

## 2014-05-10 ENCOUNTER — Other Ambulatory Visit: Payer: Self-pay | Admitting: Neurology

## 2014-07-10 ENCOUNTER — Ambulatory Visit: Payer: BC Managed Care – PPO | Admitting: Neurology

## 2014-07-28 ENCOUNTER — Telehealth: Payer: Self-pay | Admitting: Neurology

## 2014-07-28 DIAGNOSIS — G629 Polyneuropathy, unspecified: Secondary | ICD-10-CM

## 2014-07-28 MED ORDER — L-METHYLFOLATE-B6-B12 3-35-2 MG PO TABS: 1.0000 | ORAL_TABLET | Freq: Two times a day (BID) | ORAL | Status: AC

## 2014-07-28 NOTE — Telephone Encounter (Signed)
Patient called requesting a refill for l-methylfolate-B6-B12 (METANX) 3-35-2 MG TABS. [facility] [place] [facility] [phone]. Pt's c/b [phone]

## 2014-07-28 NOTE — Telephone Encounter (Signed)
Rx has been sent.  I called back to advise.  He is aware.  

## 2014-08-01 ENCOUNTER — Ambulatory Visit: Payer: BC Managed Care – PPO | Admitting: Neurology

## 2014-08-04 ENCOUNTER — Ambulatory Visit: Payer: BC Managed Care – PPO | Admitting: Neurology

## 2014-08-29 ENCOUNTER — Ambulatory Visit: Payer: BC Managed Care – PPO | Admitting: Neurology

## 2014-09-01 ENCOUNTER — Other Ambulatory Visit: Payer: Self-pay | Admitting: Family Medicine

## 2014-09-01 DIAGNOSIS — G629 Polyneuropathy, unspecified: Secondary | ICD-10-CM

## 2014-09-01 DIAGNOSIS — N319 Neuromuscular dysfunction of bladder, unspecified: Secondary | ICD-10-CM

## 2014-09-01 DIAGNOSIS — M545 Low back pain: Secondary | ICD-10-CM

## 2014-09-05 ENCOUNTER — Ambulatory Visit
Admission: RE | Admit: 2014-09-05 | Discharge: 2014-09-05 | Disposition: A | Discharge status: Home / Self Care | Payer: BC Managed Care – PPO | Source: Ambulatory Visit | Attending: Family Medicine | Admitting: Family Medicine

## 2014-09-05 DIAGNOSIS — M545 Low back pain: Secondary | ICD-10-CM

## 2014-09-05 DIAGNOSIS — N319 Neuromuscular dysfunction of bladder, unspecified: Secondary | ICD-10-CM

## 2014-09-05 DIAGNOSIS — G629 Polyneuropathy, unspecified: Secondary | ICD-10-CM

## 2014-09-06 ENCOUNTER — Ambulatory Visit
Admission: RE | Admit: 2014-09-06 | Discharge: 2014-09-06 | Disposition: A | Discharge status: Home / Self Care | Payer: BC Managed Care – PPO | Source: Ambulatory Visit | Attending: Family Medicine | Admitting: Family Medicine

## 2014-09-06 DIAGNOSIS — Z0289 Encounter for other administrative examinations: Secondary | ICD-10-CM

## 2014-09-11 ENCOUNTER — Ambulatory Visit: Payer: Self-pay | Admitting: Neurology

## 2014-11-07 ENCOUNTER — Other Ambulatory Visit: Payer: Self-pay | Admitting: Neurology

## 2014-11-28 ENCOUNTER — Other Ambulatory Visit: Payer: Self-pay | Admitting: Neurosurgery

## 2014-11-28 DIAGNOSIS — G959 Disease of spinal cord, unspecified: Secondary | ICD-10-CM

## 2014-12-06 ENCOUNTER — Ambulatory Visit
Admission: RE | Admit: 2014-12-06 | Discharge: 2014-12-06 | Disposition: A | Discharge status: Home / Self Care | Payer: BC Managed Care – PPO | Source: Ambulatory Visit | Attending: Neurosurgery | Admitting: Neurosurgery

## 2014-12-06 DIAGNOSIS — G959 Disease of spinal cord, unspecified: Secondary | ICD-10-CM

## 2014-12-07 ENCOUNTER — Other Ambulatory Visit: Payer: Self-pay | Admitting: Neurology

## 2014-12-14 ENCOUNTER — Other Ambulatory Visit: Payer: Self-pay | Admitting: Neurosurgery

## 2014-12-16 ENCOUNTER — Other Ambulatory Visit: Payer: Self-pay | Admitting: Neurology

## 2014-12-19 ENCOUNTER — Encounter (HOSPITAL_COMMUNITY): Payer: Self-pay | Admitting: *Deleted

## 2014-12-20 ENCOUNTER — Ambulatory Visit (HOSPITAL_COMMUNITY): Payer: BC Managed Care – PPO | Admitting: Anesthesiology

## 2014-12-20 ENCOUNTER — Encounter (HOSPITAL_COMMUNITY)
Admission: RE | Disposition: A | Discharge status: Home / Self Care | Payer: Self-pay | Source: Ambulatory Visit | Attending: Neurosurgery

## 2014-12-20 ENCOUNTER — Ambulatory Visit (HOSPITAL_COMMUNITY): Payer: BC Managed Care – PPO

## 2014-12-20 ENCOUNTER — Encounter (HOSPITAL_COMMUNITY): Payer: Self-pay | Admitting: *Deleted

## 2014-12-20 ENCOUNTER — Observation Stay (HOSPITAL_COMMUNITY)
Admission: RE | Admit: 2014-12-20 | Discharge: 2014-12-21 | Disposition: A | Discharge status: Home / Self Care | Payer: BC Managed Care – PPO | Source: Ambulatory Visit | Attending: Neurosurgery | Admitting: Neurosurgery

## 2014-12-20 DIAGNOSIS — Z419 Encounter for procedure for purposes other than remedying health state, unspecified: Secondary | ICD-10-CM

## 2014-12-20 DIAGNOSIS — Z91041 Radiographic dye allergy status: Secondary | ICD-10-CM | POA: Insufficient documentation

## 2014-12-20 DIAGNOSIS — E119 Type 2 diabetes mellitus without complications: Secondary | ICD-10-CM | POA: Insufficient documentation

## 2014-12-20 DIAGNOSIS — R0600 Dyspnea, unspecified: Secondary | ICD-10-CM | POA: Insufficient documentation

## 2014-12-20 DIAGNOSIS — I10 Essential (primary) hypertension: Secondary | ICD-10-CM | POA: Diagnosis not present

## 2014-12-20 DIAGNOSIS — G629 Polyneuropathy, unspecified: Secondary | ICD-10-CM | POA: Insufficient documentation

## 2014-12-20 DIAGNOSIS — M1990 Unspecified osteoarthritis, unspecified site: Secondary | ICD-10-CM | POA: Insufficient documentation

## 2014-12-20 DIAGNOSIS — G4730 Sleep apnea, unspecified: Secondary | ICD-10-CM | POA: Diagnosis not present

## 2014-12-20 DIAGNOSIS — M4806 Spinal stenosis, lumbar region: Secondary | ICD-10-CM | POA: Diagnosis not present

## 2014-12-20 DIAGNOSIS — M48061 Spinal stenosis, lumbar region without neurogenic claudication: Secondary | ICD-10-CM | POA: Diagnosis present

## 2014-12-20 DIAGNOSIS — Z87442 Personal history of urinary calculi: Secondary | ICD-10-CM | POA: Insufficient documentation

## 2014-12-20 DIAGNOSIS — F329 Major depressive disorder, single episode, unspecified: Secondary | ICD-10-CM | POA: Diagnosis not present

## 2014-12-20 DIAGNOSIS — Z885 Allergy status to narcotic agent status: Secondary | ICD-10-CM | POA: Diagnosis not present

## 2014-12-20 DIAGNOSIS — G43909 Migraine, unspecified, not intractable, without status migrainosus: Secondary | ICD-10-CM | POA: Diagnosis not present

## 2014-12-20 DIAGNOSIS — Z7982 Long term (current) use of aspirin: Secondary | ICD-10-CM | POA: Insufficient documentation

## 2014-12-20 DIAGNOSIS — M48062 Spinal stenosis, lumbar region with neurogenic claudication: Secondary | ICD-10-CM | POA: Diagnosis present

## 2014-12-20 HISTORY — DX: Personal history of urinary calculi: Z87.442

## 2014-12-20 HISTORY — DX: Essential (primary) hypertension: I10

## 2014-12-20 HISTORY — DX: Depression, unspecified: F32.A

## 2014-12-20 HISTORY — DX: Major depressive disorder, single episode, unspecified: F32.9

## 2014-12-20 HISTORY — DX: Unspecified osteoarthritis, unspecified site: M19.90

## 2014-12-20 HISTORY — DX: Type 2 diabetes mellitus without complications: E11.9

## 2014-12-20 HISTORY — DX: Reserved for inherently not codable concepts without codable children: IMO0001

## 2014-12-20 HISTORY — PX: LUMBAR LAMINECTOMY/DECOMPRESSION MICRODISCECTOMY: SHX5026

## 2014-12-20 HISTORY — DX: Unspecified intracranial injury with loss of consciousness of unspecified duration, initial encounter: S06.9X9A

## 2014-12-20 LAB — GLUCOSE, CAPILLARY
GLUCOSE-CAPILLARY: 112 mg/dL — AB (ref 65–99)
GLUCOSE-CAPILLARY: 166 mg/dL — AB (ref 65–99)
GLUCOSE-CAPILLARY: 224 mg/dL — AB (ref 65–99)
Glucose-Capillary: 137 mg/dL — ABNORMAL HIGH (ref 65–99)
Glucose-Capillary: 234 mg/dL — ABNORMAL HIGH (ref 65–99)

## 2014-12-20 LAB — CBC WITH DIFFERENTIAL/PLATELET
BASOS PCT: 1 %
Basophils Absolute: 0 10*3/uL (ref 0.0–0.1)
EOS ABS: 0.3 10*3/uL (ref 0.0–0.7)
Eosinophils Relative: 4 %
HCT: 39.8 % (ref 39.0–52.0)
HEMOGLOBIN: 13.5 g/dL (ref 13.0–17.0)
Lymphocytes Relative: 37 %
Lymphs Abs: 2.4 10*3/uL (ref 0.7–4.0)
MCH: 33.8 pg (ref 26.0–34.0)
MCHC: 33.9 g/dL (ref 30.0–36.0)
MCV: 99.5 fL (ref 78.0–100.0)
MONOS PCT: 9 %
Monocytes Absolute: 0.6 10*3/uL (ref 0.1–1.0)
NEUTROS PCT: 49 %
Neutro Abs: 3.2 10*3/uL (ref 1.7–7.7)
Platelets: 194 10*3/uL (ref 150–400)
RBC: 4 MIL/uL — ABNORMAL LOW (ref 4.22–5.81)
RDW: 13.5 % (ref 11.5–15.5)
WBC: 6.5 10*3/uL (ref 4.0–10.5)

## 2014-12-20 LAB — BASIC METABOLIC PANEL
Anion gap: 9 (ref 5–15)
BUN: 11 mg/dL (ref 6–20)
CALCIUM: 9.5 mg/dL (ref 8.9–10.3)
CO2: 28 mmol/L (ref 22–32)
CREATININE: 1.1 mg/dL (ref 0.61–1.24)
Chloride: 103 mmol/L (ref 101–111)
GFR calc non Af Amer: >60 mL/min (ref >60)
Glucose, Bld: 117 mg/dL — ABNORMAL HIGH (ref 65–99)
Potassium: 3.9 mmol/L (ref 3.5–5.1)
SODIUM: 140 mmol/L (ref 135–145)

## 2014-12-20 LAB — SURGICAL PCR SCREEN
MRSA, PCR: NEGATIVE
STAPHYLOCOCCUS AUREUS: POSITIVE — AB

## 2014-12-20 SURGERY — LUMBAR LAMINECTOMY/DECOMPRESSION MICRODISCECTOMY 2 LEVELS
Anesthesia: General | Site: Spine Lumbar | Laterality: Bilateral

## 2014-12-20 MED ORDER — SODIUM CHLORIDE 0.9 % IJ SOLN: 3.0000 mL | INTRAMUSCULAR | Status: DC | PRN

## 2014-12-20 MED ORDER — POTASSIUM CITRATE ER 10 MEQ (1080 MG) PO TBCR
10.0000 meq | EXTENDED_RELEASE_TABLET | Freq: Three times a day (TID) | ORAL | Status: DC
  Administered 2014-12-20 – 2014-12-21 (×2): 10 meq via ORAL
  Filled 2014-12-20 (×5): qty 1

## 2014-12-20 MED ORDER — SODIUM CHLORIDE 0.9 % IR SOLN
Status: DC | PRN
  Administered 2014-12-20: 09:00:00

## 2014-12-20 MED ORDER — STERILE WATER FOR INJECTION IJ SOLN
INTRAMUSCULAR | Status: AC
Start: 2014-12-20 — End: 2014-12-20
  Filled 2014-12-20: qty 10

## 2014-12-20 MED ORDER — PROMETHAZINE HCL 25 MG/ML IJ SOLN: 6.2500 mg | INTRAMUSCULAR | Status: DC | PRN

## 2014-12-20 MED ORDER — PHENOL 1.4 % MT LIQD: 1.0000 | OROMUCOSAL | Status: DC | PRN

## 2014-12-20 MED ORDER — ESMOLOL HCL 10 MG/ML IV SOLN
INTRAVENOUS | Status: DC | PRN
  Administered 2014-12-20: 20 mg via INTRAVENOUS

## 2014-12-20 MED ORDER — THROMBIN 5000 UNITS EX SOLR
CUTANEOUS | Status: DC | PRN
  Administered 2014-12-20 (×2): 5000 [IU] via TOPICAL

## 2014-12-20 MED ORDER — ATORVASTATIN CALCIUM 20 MG PO TABS
10.0000 mg | ORAL_TABLET | Freq: Every day | ORAL | Status: DC
  Administered 2014-12-20: 10 mg via ORAL
  Filled 2014-12-20: qty 1

## 2014-12-20 MED ORDER — L-METHYLFOLATE-B6-B12 3-35-2 MG PO TABS
1.0000 | ORAL_TABLET | Freq: Two times a day (BID) | ORAL | Status: DC
  Administered 2014-12-20: 1 via ORAL
  Filled 2014-12-20 (×3): qty 1

## 2014-12-20 MED ORDER — METOPROLOL SUCCINATE ER 25 MG PO TB24
25.0000 mg | ORAL_TABLET | ORAL | Status: DC
  Administered 2014-12-20: 25 mg via ORAL
  Filled 2014-12-20: qty 1

## 2014-12-20 MED ORDER — OXYCODONE HCL 5 MG PO TABS
15.0000 mg | ORAL_TABLET | ORAL | Status: DC | PRN
  Administered 2014-12-20 – 2014-12-21 (×5): 15 mg via ORAL
  Filled 2014-12-20 (×5): qty 3

## 2014-12-20 MED ORDER — MAGNESIUM 250 MG PO TABS: 250.0000 mg | ORAL_TABLET | Freq: Every day | ORAL | Status: DC

## 2014-12-20 MED ORDER — MUPIROCIN 2 % EX OINT
1.0000 "application " | TOPICAL_OINTMENT | Freq: Once | CUTANEOUS | Status: AC
  Administered 2014-12-20: 1 via TOPICAL
  Filled 2014-12-20: qty 22

## 2014-12-20 MED ORDER — FENTANYL CITRATE (PF) 100 MCG/2ML IJ SOLN
INTRAMUSCULAR | Status: AC
  Filled 2014-12-20: qty 2

## 2014-12-20 MED ORDER — FENTANYL CITRATE (PF) 100 MCG/2ML IJ SOLN
INTRAMUSCULAR | Status: DC | PRN
  Administered 2014-12-20: 50 ug via INTRAVENOUS
  Administered 2014-12-20: 150 ug via INTRAVENOUS
  Administered 2014-12-20: 50 ug via INTRAVENOUS

## 2014-12-20 MED ORDER — HEMOSTATIC AGENTS (NO CHARGE) OPTIME
TOPICAL | Status: DC | PRN
  Administered 2014-12-20: 1 via TOPICAL

## 2014-12-20 MED ORDER — GLYCOPYRROLATE 0.2 MG/ML IJ SOLN
INTRAMUSCULAR | Status: AC
  Filled 2014-12-20: qty 3

## 2014-12-20 MED ORDER — SODIUM CHLORIDE 0.9 % IJ SOLN
3.0000 mL | Freq: Two times a day (BID) | INTRAMUSCULAR | Status: DC
  Administered 2014-12-20 (×2): 3 mL via INTRAVENOUS

## 2014-12-20 MED ORDER — 0.9 % SODIUM CHLORIDE (POUR BTL) OPTIME
TOPICAL | Status: DC | PRN
  Administered 2014-12-20: 1000 mL

## 2014-12-20 MED ORDER — INDAPAMIDE 1.25 MG PO TABS
1.2500 mg | ORAL_TABLET | Freq: Every day | ORAL | Status: DC
  Filled 2014-12-20: qty 1

## 2014-12-20 MED ORDER — ARTIFICIAL TEARS OP OINT
TOPICAL_OINTMENT | OPHTHALMIC | Status: DC | PRN
  Administered 2014-12-20: 1 via OPHTHALMIC

## 2014-12-20 MED ORDER — VITAMIN D 1000 UNITS PO TABS
1000.0000 [IU] | ORAL_TABLET | Freq: Every day | ORAL | Status: DC
  Administered 2014-12-20: 1000 [IU] via ORAL
  Filled 2014-12-20 (×2): qty 1

## 2014-12-20 MED ORDER — EPHEDRINE SULFATE 50 MG/ML IJ SOLN
INTRAMUSCULAR | Status: AC
Start: 2014-12-20 — End: 2014-12-20
  Filled 2014-12-20: qty 1

## 2014-12-20 MED ORDER — TIZANIDINE HCL 4 MG PO TABS
4.0000 mg | ORAL_TABLET | Freq: Three times a day (TID) | ORAL | Status: DC | PRN
  Filled 2014-12-20: qty 1

## 2014-12-20 MED ORDER — ORPHENADRINE CITRATE ER 100 MG PO TB12
100.0000 mg | ORAL_TABLET | Freq: Two times a day (BID) | ORAL | Status: DC | PRN
  Filled 2014-12-20: qty 1

## 2014-12-20 MED ORDER — PROPOFOL 10 MG/ML IV BOLUS
INTRAVENOUS | Status: AC
Start: 2014-12-20 — End: 2014-12-20
  Filled 2014-12-20: qty 20

## 2014-12-20 MED ORDER — NEOSTIGMINE METHYLSULFATE 10 MG/10ML IV SOLN
INTRAVENOUS | Status: DC | PRN
  Administered 2014-12-20: 4 mg via INTRAVENOUS

## 2014-12-20 MED ORDER — KETOROLAC TROMETHAMINE 30 MG/ML IJ SOLN
30.0000 mg | Freq: Four times a day (QID) | INTRAMUSCULAR | Status: DC
  Administered 2014-12-20 – 2014-12-21 (×4): 30 mg via INTRAVENOUS
  Filled 2014-12-20 (×4): qty 1

## 2014-12-20 MED ORDER — HYDROMORPHONE HCL 1 MG/ML IJ SOLN: 0.5000 mg | INTRAMUSCULAR | Status: DC | PRN

## 2014-12-20 MED ORDER — PANTOPRAZOLE SODIUM 40 MG PO TBEC
40.0000 mg | DELAYED_RELEASE_TABLET | Freq: Two times a day (BID) | ORAL | Status: DC
Start: 2014-12-20 — End: 2014-12-21
  Administered 2014-12-20 – 2014-12-21 (×2): 40 mg via ORAL
  Filled 2014-12-20 (×2): qty 1

## 2014-12-20 MED ORDER — NEOSTIGMINE METHYLSULFATE 10 MG/10ML IV SOLN
INTRAVENOUS | Status: AC
  Filled 2014-12-20: qty 1

## 2014-12-20 MED ORDER — LACTATED RINGERS IV SOLN
INTRAVENOUS | Status: DC | PRN
  Administered 2014-12-20 (×2): via INTRAVENOUS

## 2014-12-20 MED ORDER — FENTANYL CITRATE (PF) 250 MCG/5ML IJ SOLN
INTRAMUSCULAR | Status: AC
  Filled 2014-12-20: qty 5

## 2014-12-20 MED ORDER — MAGNESIUM OXIDE 400 (241.3 MG) MG PO TABS
400.0000 mg | ORAL_TABLET | Freq: Every day | ORAL | Status: DC
  Administered 2014-12-20: 400 mg via ORAL
  Filled 2014-12-20 (×2): qty 1

## 2014-12-20 MED ORDER — LIDOCAINE HCL (CARDIAC) 20 MG/ML IV SOLN
INTRAVENOUS | Status: DC | PRN
  Administered 2014-12-20: 100 mg via INTRAVENOUS

## 2014-12-20 MED ORDER — METFORMIN HCL 500 MG PO TABS
500.0000 mg | ORAL_TABLET | Freq: Two times a day (BID) | ORAL | Status: DC
  Administered 2014-12-20 – 2014-12-21 (×2): 500 mg via ORAL
  Filled 2014-12-20 (×2): qty 1

## 2014-12-20 MED ORDER — VANCOMYCIN HCL 1000 MG IV SOLR
INTRAVENOUS | Status: AC
  Filled 2014-12-20: qty 1000

## 2014-12-20 MED ORDER — ROCURONIUM BROMIDE 50 MG/5ML IV SOLN
INTRAVENOUS | Status: AC
  Filled 2014-12-20: qty 1

## 2014-12-20 MED ORDER — MIDAZOLAM HCL 2 MG/2ML IJ SOLN
INTRAMUSCULAR | Status: AC
  Filled 2014-12-20: qty 4

## 2014-12-20 MED ORDER — OXYCODONE HCL ER 10 MG PO T12A
30.0000 mg | EXTENDED_RELEASE_TABLET | Freq: Two times a day (BID) | ORAL | Status: DC
  Administered 2014-12-20 – 2014-12-21 (×2): 30 mg via ORAL
  Filled 2014-12-20 (×2): qty 3

## 2014-12-20 MED ORDER — ACETAMINOPHEN 325 MG PO TABS: 650.0000 mg | ORAL_TABLET | ORAL | Status: DC | PRN

## 2014-12-20 MED ORDER — OMEGA-3-ACID ETHYL ESTERS 1 G PO CAPS
1.0000 g | ORAL_CAPSULE | Freq: Every day | ORAL | Status: DC
  Administered 2014-12-20: 1 g via ORAL
  Filled 2014-12-20 (×2): qty 1

## 2014-12-20 MED ORDER — ONDANSETRON HCL 4 MG/2ML IJ SOLN
INTRAMUSCULAR | Status: AC
  Filled 2014-12-20: qty 2

## 2014-12-20 MED ORDER — TAMSULOSIN HCL 0.4 MG PO CAPS: 0.4000 mg | ORAL_CAPSULE | Freq: Every day | ORAL | Status: DC

## 2014-12-20 MED ORDER — PRAMIPEXOLE DIHYDROCHLORIDE 0.25 MG PO TABS
0.2500 mg | ORAL_TABLET | Freq: Three times a day (TID) | ORAL | Status: DC
  Administered 2014-12-20 (×2): 0.25 mg via ORAL
  Filled 2014-12-20 (×5): qty 1

## 2014-12-20 MED ORDER — FENTANYL CITRATE (PF) 100 MCG/2ML IJ SOLN
25.0000 ug | INTRAMUSCULAR | Status: DC | PRN
  Administered 2014-12-20 (×3): 50 ug via INTRAVENOUS

## 2014-12-20 MED ORDER — CEFAZOLIN SODIUM-DEXTROSE 2-3 GM-% IV SOLR
2.0000 g | INTRAVENOUS | Status: AC
  Administered 2014-12-20: 2 g via INTRAVENOUS
  Filled 2014-12-20: qty 50

## 2014-12-20 MED ORDER — MIDAZOLAM HCL 5 MG/5ML IJ SOLN
INTRAMUSCULAR | Status: DC | PRN
  Administered 2014-12-20: 2 mg via INTRAVENOUS

## 2014-12-20 MED ORDER — DIAZEPAM 5 MG PO TABS
5.0000 mg | ORAL_TABLET | ORAL | Status: DC | PRN
  Administered 2014-12-20 – 2014-12-21 (×3): 5 mg via ORAL
  Filled 2014-12-20 (×3): qty 1

## 2014-12-20 MED ORDER — GLYCOPYRROLATE 0.2 MG/ML IJ SOLN
INTRAMUSCULAR | Status: DC | PRN
  Administered 2014-12-20: 0.6 mg via INTRAVENOUS

## 2014-12-20 MED ORDER — INSULIN ASPART 100 UNIT/ML ~~LOC~~ SOLN
0.0000 [IU] | Freq: Every day | SUBCUTANEOUS | Status: DC
  Administered 2014-12-20: 2 [IU] via SUBCUTANEOUS

## 2014-12-20 MED ORDER — PROPOFOL 10 MG/ML IV BOLUS
INTRAVENOUS | Status: AC
  Filled 2014-12-20: qty 20

## 2014-12-20 MED ORDER — DEXAMETHASONE SODIUM PHOSPHATE 10 MG/ML IJ SOLN
10.0000 mg | INTRAMUSCULAR | Status: AC
  Administered 2014-12-20: 10 mg via INTRAVENOUS
  Filled 2014-12-20: qty 1

## 2014-12-20 MED ORDER — ONDANSETRON HCL 4 MG/2ML IJ SOLN
INTRAMUSCULAR | Status: DC | PRN
  Administered 2014-12-20: 4 mg via INTRAVENOUS

## 2014-12-20 MED ORDER — OMEPRAZOLE MAGNESIUM 20 MG PO TBEC: 20.0000 mg | DELAYED_RELEASE_TABLET | Freq: Two times a day (BID) | ORAL | Status: DC

## 2014-12-20 MED ORDER — SODIUM CHLORIDE 0.9 % IJ SOLN
INTRAMUSCULAR | Status: AC
  Filled 2014-12-20: qty 10

## 2014-12-20 MED ORDER — BUPROPION HCL ER (XL) 300 MG PO TB24
300.0000 mg | ORAL_TABLET | Freq: Every day | ORAL | Status: DC
  Filled 2014-12-20: qty 1

## 2014-12-20 MED ORDER — PROPOFOL 10 MG/ML IV BOLUS
INTRAVENOUS | Status: DC | PRN
  Administered 2014-12-20: 200 mg via INTRAVENOUS

## 2014-12-20 MED ORDER — MENTHOL 3 MG MT LOZG: 1.0000 | LOZENGE | OROMUCOSAL | Status: DC | PRN

## 2014-12-20 MED ORDER — INSULIN ASPART 100 UNIT/ML ~~LOC~~ SOLN
0.0000 [IU] | Freq: Three times a day (TID) | SUBCUTANEOUS | Status: DC
  Administered 2014-12-21: 3 [IU] via SUBCUTANEOUS

## 2014-12-20 MED ORDER — VANCOMYCIN HCL 1000 MG IV SOLR
INTRAVENOUS | Status: DC | PRN
  Administered 2014-12-20: 1000 mg via TOPICAL

## 2014-12-20 MED ORDER — ACETAMINOPHEN 650 MG RE SUPP: 650.0000 mg | RECTAL | Status: DC | PRN

## 2014-12-20 MED ORDER — LIDOCAINE HCL (CARDIAC) 20 MG/ML IV SOLN
INTRAVENOUS | Status: AC
  Filled 2014-12-20: qty 5

## 2014-12-20 MED ORDER — CEFAZOLIN SODIUM 1-5 GM-% IV SOLN
1.0000 g | Freq: Three times a day (TID) | INTRAVENOUS | Status: AC
  Administered 2014-12-20 (×2): 1 g via INTRAVENOUS
  Filled 2014-12-20 (×2): qty 50

## 2014-12-20 MED ORDER — MUPIROCIN 2 % EX OINT
1.0000 "application " | TOPICAL_OINTMENT | Freq: Two times a day (BID) | CUTANEOUS | Status: DC
  Administered 2014-12-20: 1 via NASAL
  Filled 2014-12-20: qty 22

## 2014-12-20 MED ORDER — PYRIDOXINE HCL 25 MG PO TABS
25.0000 mg | ORAL_TABLET | Freq: Every day | ORAL | Status: DC
  Administered 2014-12-20: 25 mg via ORAL
  Filled 2014-12-20 (×2): qty 1

## 2014-12-20 MED ORDER — SUCCINYLCHOLINE CHLORIDE 20 MG/ML IJ SOLN
INTRAMUSCULAR | Status: AC
  Filled 2014-12-20: qty 1

## 2014-12-20 MED ORDER — KETOROLAC TROMETHAMINE 30 MG/ML IJ SOLN
INTRAMUSCULAR | Status: AC
  Filled 2014-12-20: qty 1

## 2014-12-20 MED ORDER — PREGABALIN 75 MG PO CAPS
150.0000 mg | ORAL_CAPSULE | Freq: Three times a day (TID) | ORAL | Status: DC
  Administered 2014-12-20 (×2): 150 mg via ORAL
  Filled 2014-12-20 (×2): qty 2

## 2014-12-20 MED ORDER — OMEGA-3 1000 MG PO CAPS: 1000.0000 mg | ORAL_CAPSULE | Freq: Every day | ORAL | Status: DC

## 2014-12-20 MED ORDER — TRAZODONE HCL 50 MG PO TABS
50.0000 mg | ORAL_TABLET | Freq: Every evening | ORAL | Status: DC | PRN
  Administered 2014-12-20: 50 mg via ORAL
  Filled 2014-12-20 (×2): qty 1

## 2014-12-20 MED ORDER — ROCURONIUM BROMIDE 100 MG/10ML IV SOLN
INTRAVENOUS | Status: DC | PRN
  Administered 2014-12-20: 50 mg via INTRAVENOUS

## 2014-12-20 MED ORDER — HYDROCODONE-ACETAMINOPHEN 5-325 MG PO TABS: 1.0000 | ORAL_TABLET | ORAL | Status: DC | PRN

## 2014-12-20 MED ORDER — ARTIFICIAL TEARS OP OINT
TOPICAL_OINTMENT | OPHTHALMIC | Status: AC
  Filled 2014-12-20: qty 3.5

## 2014-12-20 MED ORDER — ONDANSETRON HCL 4 MG/2ML IJ SOLN: 4.0000 mg | INTRAMUSCULAR | Status: DC | PRN

## 2014-12-20 MED ORDER — DULOXETINE HCL 30 MG PO CPEP
60.0000 mg | ORAL_CAPSULE | Freq: Two times a day (BID) | ORAL | Status: DC
  Administered 2014-12-20 – 2014-12-21 (×2): 60 mg via ORAL
  Filled 2014-12-20 (×2): qty 2

## 2014-12-20 MED ORDER — ASPIRIN 81 MG PO CHEW
81.0000 mg | CHEWABLE_TABLET | Freq: Every day | ORAL | Status: DC
  Administered 2014-12-20: 81 mg via ORAL
  Filled 2014-12-20: qty 1

## 2014-12-20 SURGICAL SUPPLY — 45 items
BAG DECANTER FOR FLEXI CONT (MISCELLANEOUS) ×3 IMPLANT
BENZOIN TINCTURE PRP APPL 2/3 (GAUZE/BANDAGES/DRESSINGS) ×3 IMPLANT
BLADE CLIPPER SURG (BLADE) IMPLANT
BRUSH SCRUB EZ PLAIN DRY (MISCELLANEOUS) ×3 IMPLANT
BUR CUTTER 7.0 ROUND (BURR) ×3 IMPLANT
CANISTER SUCT 3000ML PPV (MISCELLANEOUS) ×3 IMPLANT
CLOSURE WOUND 1/2 X4 (GAUZE/BANDAGES/DRESSINGS) ×1
DECANTER SPIKE VIAL GLASS SM (MISCELLANEOUS) IMPLANT
DRAPE LAPAROTOMY 100X72X124 (DRAPES) ×3 IMPLANT
DRAPE MICROSCOPE LEICA (MISCELLANEOUS) ×3 IMPLANT
DRAPE POUCH INSTRU U-SHP 10X18 (DRAPES) ×3 IMPLANT
DRAPE PROXIMA HALF (DRAPES) ×3 IMPLANT
DRAPE SURG 17X23 STRL (DRAPES) ×6 IMPLANT
DRSG OPSITE POSTOP 3X4 (GAUZE/BANDAGES/DRESSINGS) ×3 IMPLANT
DURAPREP 26ML APPLICATOR (WOUND CARE) ×3 IMPLANT
ELECT REM PT RETURN 9FT ADLT (ELECTROSURGICAL) ×3
ELECTRODE REM PT RTRN 9FT ADLT (ELECTROSURGICAL) ×1 IMPLANT
GAUZE SPONGE 4X4 12PLY STRL (GAUZE/BANDAGES/DRESSINGS) IMPLANT
GAUZE SPONGE 4X4 16PLY XRAY LF (GAUZE/BANDAGES/DRESSINGS) IMPLANT
GLOVE BIO SURGEON STRL SZ7.5 (GLOVE) ×3 IMPLANT
GLOVE ECLIPSE 9.0 STRL (GLOVE) ×6 IMPLANT
GLOVE SS BIOGEL STRL SZ 7 (GLOVE) ×1 IMPLANT
GLOVE SUPERSENSE BIOGEL SZ 7 (GLOVE) ×2
GLOVE SURG SS PI 7.5 STRL IVOR (GLOVE) ×3 IMPLANT
GOWN STRL REUS W/ TWL LRG LVL3 (GOWN DISPOSABLE) ×1 IMPLANT
GOWN STRL REUS W/ TWL XL LVL3 (GOWN DISPOSABLE) ×2 IMPLANT
GOWN STRL REUS W/TWL 2XL LVL3 (GOWN DISPOSABLE) IMPLANT
GOWN STRL REUS W/TWL LRG LVL3 (GOWN DISPOSABLE) ×2
GOWN STRL REUS W/TWL XL LVL3 (GOWN DISPOSABLE) ×4
KIT BASIN OR (CUSTOM PROCEDURE TRAY) ×3 IMPLANT
KIT ROOM TURNOVER OR (KITS) ×3 IMPLANT
LIQUID BAND (GAUZE/BANDAGES/DRESSINGS) ×3 IMPLANT
NEEDLE HYPO 22GX1.5 SAFETY (NEEDLE) ×3 IMPLANT
NEEDLE SPNL 22GX3.5 QUINCKE BK (NEEDLE) ×3 IMPLANT
NS IRRIG 1000ML POUR BTL (IV SOLUTION) ×3 IMPLANT
PACK LAMINECTOMY NEURO (CUSTOM PROCEDURE TRAY) ×3 IMPLANT
PAD ARMBOARD 7.5X6 YLW CONV (MISCELLANEOUS) ×9 IMPLANT
RUBBERBAND STERILE (MISCELLANEOUS) ×6 IMPLANT
SPONGE SURGIFOAM ABS GEL SZ50 (HEMOSTASIS) ×3 IMPLANT
STRIP CLOSURE SKIN 1/2X4 (GAUZE/BANDAGES/DRESSINGS) ×2 IMPLANT
SUT VIC AB 2-0 CT1 18 (SUTURE) ×3 IMPLANT
SUT VIC AB 3-0 SH 8-18 (SUTURE) ×3 IMPLANT
TOWEL OR 17X24 6PK STRL BLUE (TOWEL DISPOSABLE) ×3 IMPLANT
TOWEL OR 17X26 10 PK STRL BLUE (TOWEL DISPOSABLE) ×3 IMPLANT
WATER STERILE IRR 1000ML POUR (IV SOLUTION) ×3 IMPLANT

## 2014-12-20 NOTE — Anesthesia Preprocedure Evaluation (Addendum)
Anesthesia Evaluation  Patient identified by MRN, date of birth, ID band Patient awake    Reviewed: Allergy & Precautions, NPO status , Patient's Chart, lab work & pertinent test results, reviewed documented beta blocker date and time   History of Anesthesia Complications Negative for: history of anesthetic complications  Airway Mallampati: III  TM Distance: >3 FB Neck ROM: Full    Dental  (+) Teeth Intact, Dental Advisory Given   Pulmonary sleep apnea (does not use his CPAP) ,    Pulmonary exam normal breath sounds clear to auscultation       Cardiovascular Exercise Tolerance: Good hypertension, Pt. on medications (-) angina(-) Past MI Normal cardiovascular exam Rhythm:Regular Rate:Normal     Neuro/Psych  Headaches, PSYCHIATRIC DISORDERS Depression  Neuromuscular disease    GI/Hepatic negative GI ROS, Neg liver ROS,   Endo/Other  diabetes, Well Controlled, Type 2, Oral Hypoglycemic AgentsObesity   Renal/GU negative Renal ROS     Musculoskeletal  (+) Arthritis , Osteoarthritis,    Abdominal   Peds  Hematology negative hematology ROS (+)   Anesthesia Other Findings Day of surgery medications reviewed with the patient.  Reproductive/Obstetrics                            Anesthesia Physical Anesthesia Plan  ASA: II  Anesthesia Plan: General   Post-op Pain Management:    Induction: Intravenous  Airway Management Planned: Oral ETT  Additional Equipment:   Intra-op Plan:   Post-operative Plan: Extubation in OR  Informed Consent: I have reviewed the patients History and Physical, chart, labs and discussed the procedure including the risks, benefits and alternatives for the proposed anesthesia with the patient or authorized representative who has indicated his/her understanding and acceptance.   Dental advisory given  Plan Discussed with: CRNA  Anesthesia Plan Comments:  (Risks/benefits of general anesthesia discussed with patient including risk of damage to teeth, lips, gum, and tongue, nausea/vomiting, allergic reactions to medications, and the possibility of heart attack, stroke and death.  All patient questions answered.  Patient wishes to proceed.)        Anesthesia Quick Evaluation

## 2014-12-20 NOTE — Brief Op Note (Signed)
12/20/2014  10:19 AM  PATIENT:  Leroy Williams  54 y.o. male  PRE-OPERATIVE DIAGNOSIS:  Stenosis  POST-OPERATIVE DIAGNOSIS:  Stenosis  PROCEDURE:  Procedure(s): Lumbar Three-Four, Lumbar Four-Five Laminectomy (Bilateral)  SURGEON:  Surgeon(s) and Role:    * Julio Sicks, MD - Primary    * Loura Halt Ditty, MD - Assisting  PHYSICIAN ASSISTANT:   ASSISTANTS:    ANESTHESIA:   general  EBL:  Total I/O In: 800 [I.V.:800] Out: 300 [Blood:300]  BLOOD ADMINISTERED:none  DRAINS: none   LOCAL MEDICATIONS USED:  MARCAINE     SPECIMEN:  No Specimen  DISPOSITION OF SPECIMEN:  N/A  COUNTS:  YES  TOURNIQUET:  * No tourniquets in log *  DICTATION: .Dragon Dictation  PLAN OF CARE: Admit for overnight observation  PATIENT DISPOSITION:  PACU - hemodynamically stable.   Delay start of Pharmacological VTE agent (>24hrs) due to surgical blood loss or risk of bleeding: yes

## 2014-12-20 NOTE — H&P (Signed)
Leroy Williams is an 54 y.o. male.   Chief Complaint: Bilateral lower extremity pain and numbness HPI: 54 year old male with bilateral lower extremity pain and numbness which is been progressive over time. Workup demonstrates evidence of marked lumbar stenosis at L3-4 and L4-5. Patient is failed conservative management. He presents now for decompressive surgery.  Past Medical History  Diagnosis Date  . Migraine   . Neuropathy   . Sleep apnea   . H/O rhinoplasty   . Short lasting unilateral neuralgiform headache with conjunctival injection and tearing (sunct), not intractable 08/19/2012  . Neuropathy   . Small fiber neuropathy   . Small fiber neuropathy 07/21/2013    Biopsy confirmed diagnosis, 05-2013 , dr Krista Blue   . History of kidney stones     over 200  . Hypertension   . Head injury, acute, with loss of consciousness     as a child  . Shortness of breath dyspnea     with  exertion  . Diabetes mellitus without complication     Type II  . Depression   . Arthritis     Past Surgical History  Procedure Laterality Date  . Appendectomy  1982    age 87  . Kidney stone surgery  june 29,2015  . Cystoscopy w/ stone manipulation      8 times  . Lithotripsy      18 times  . Ureter surgery  2006ish    repair from traumatic catheterization  . Nasal septum surgery    . Nasal fracture surgery    . Nephrolithotomy Left 1983    Family History  Problem Relation Age of Onset  . Diabetes Father   . Kidney Stones Father   . Heart disease Paternal Grandfather    Social History:  reports that he has never smoked. He has never used smokeless tobacco. He reports that he drinks alcohol. He reports that he does not use illicit drugs.  Allergies:  Allergies  Allergen Reactions  . Codeine Itching  . Ivp Dye [Iodinated Diagnostic Agents] Other (See Comments)    Chest pains    Medications Prior to Admission  Medication Sig Dispense Refill  . aspirin-acetaminophen-caffeine (EXCEDRIN MIGRAINE)  250-250-65 MG per tablet Take by mouth every 6 (six) hours as needed for headache.    Marland Kitchen atorvastatin (LIPITOR) 10 MG tablet Take 10 mg by mouth daily at 6 PM.    . buPROPion (WELLBUTRIN XL) 300 MG 24 hr tablet     . Cholecalciferol (VITAMIN D-1000 MAX ST) 1000 UNITS tablet Take 1,000 Units by mouth daily.    . diazepam (VALIUM) 5 MG tablet Take 5 mg by mouth as needed (MRI ansd such).     . DULoxetine (CYMBALTA) 60 MG capsule Take 60 mg by mouth 2 (two) times daily.     Marland Kitchen HYDROmorphone (DILAUDID) 4 MG tablet Take 2 mg by mouth as needed for severe pain (kidney stones).     . indapamide (LOZOL) 1.25 MG tablet Take 1.25 mg by mouth daily.    Marland Kitchen l-methylfolate-B6-B12 (METANX) 3-35-2 MG TABS Take 1 tablet by mouth 2 (two) times daily between meals. 180 tablet 0  . Magnesium 250 MG TABS Take 250 mg by mouth daily.    . metFORMIN (GLUCOPHAGE) 500 MG tablet Take by mouth 2 (two) times daily with a meal.    . metoprolol succinate (TOPROL-XL) 25 MG 24 hr tablet TAKE 1 TABLET AT LUNCH AND 1 TABLET EACH NIGHT 30 tablet 0  . orphenadrine (NORFLEX) 100  MG tablet TAKE 1 TABLET (100 MG TOTAL) BY MOUTH 2 (TWO) TIMES DAILY AS NEEDED FOR MUSCLE SPASMS. (Patient taking differently: TAKE 1 TABLET (100 MG TOTAL) BY MOUTH 2 (TWO)  prn Migraines) 60 tablet 6  . oxyCODONE (ROXICODONE) 15 MG immediate release tablet Take 15 mg by mouth. 5 times a day as needed    . oxyCODONE-acetaminophen (PERCOCET/ROXICET) 5-325 MG per tablet Take by mouth every 6 (six) hours as needed.     . OXYCONTIN 10 MG T12A 12 hr tablet 30 mg every 12 (twelve) hours. As needed    . potassium citrate (UROCIT-K) 10 MEQ (1080 MG) SR tablet     . pramipexole (MIRAPEX) 0.5 MG tablet Take 1 tablet (0.5 mg total) by mouth 3 (three) times daily. (Patient taking differently: Take 0.25 mg by mouth 3 (three) times daily. ) 60 tablet 5  . pregabalin (LYRICA) 100 MG capsule Take 1 capsule (100 mg total) by mouth 3 (three) times daily. (Patient taking differently:  Take 150 mg by mouth 3 (three) times daily. ) 30 capsule 2  . PRILOSEC OTC 20 MG tablet daily.     Marland Kitchen pyridOXINE (VITAMIN B-6) 25 MG tablet Take 25 mg by mouth daily.    . tamsulosin (FLOMAX) 0.4 MG CAPS     . tiZANidine (ZANAFLEX) 4 MG capsule TAKE 1 CAPSULE (4 MG TOTAL) BY MOUTH 3 (THREE) TIMES DAILY AS NEEDED FOR MUSCLE SPASMS. 90 capsule 6  . traZODone (DESYREL) 50 MG tablet 50 mg at bedtime as needed.     Marland Kitchen aspirin 81 MG chewable tablet Chew 81 mg by mouth daily.    Marland Kitchen gabapentin (NEURONTIN) 600 MG tablet Take four times daily. 120 tablet 5  . Omega-3 1000 MG CAPS Take 1 g by mouth daily.    . pravastatin (PRAVACHOL) 40 MG tablet Take 40 mg by mouth Nightly. One each night    . promethazine (PHENERGAN) 50 MG tablet TAKE 1 TABLET (50 MG TOTAL) BY MOUTH EVERY 6 (SIX) HOURS AS NEEDED FOR NAUSEA OR VOMITING. 30 tablet 3  . testosterone cypionate (DEPOTESTOTERONE CYPIONATE) 200 MG/ML injection every 14 (fourteen) days. Injection      Results for orders placed or performed during the hospital encounter of 12/20/14 (from the past 48 hour(s))  Glucose, capillary     Status: Abnormal   Collection Time: 12/20/14  7:02 AM  Result Value Ref Range   Glucose-Capillary 112 (H) 65 - 99 mg/dL  Basic metabolic panel     Status: Abnormal   Collection Time: 12/20/14  7:08 AM  Result Value Ref Range   Sodium 140 135 - 145 mmol/L   Potassium 3.9 3.5 - 5.1 mmol/L   Chloride 103 101 - 111 mmol/L   CO2 28 22 - 32 mmol/L   Glucose, Bld 117 (H) 65 - 99 mg/dL   BUN 11 6 - 20 mg/dL   Creatinine, Ser 1.10 0.61 - 1.24 mg/dL   Calcium 9.5 8.9 - 10.3 mg/dL   GFR calc non Af Amer >60 >60 mL/min   GFR calc Af Amer >60 >60 mL/min    Comment: (NOTE) The eGFR has been calculated using the CKD EPI equation. This calculation has not been validated in all clinical situations. eGFR's persistently <60 mL/min signify possible Chronic Kidney Disease.    Anion gap 9 5 - 15  CBC WITH DIFFERENTIAL     Status: Abnormal    Collection Time: 12/20/14  7:08 AM  Result Value Ref Range   WBC 6.5  4.0 - 10.5 K/uL   RBC 4.00 (L) 4.22 - 5.81 MIL/uL   Hemoglobin 13.5 13.0 - 17.0 g/dL   HCT 39.8 39.0 - 52.0 %   MCV 99.5 78.0 - 100.0 fL   MCH 33.8 26.0 - 34.0 pg   MCHC 33.9 30.0 - 36.0 g/dL   RDW 13.5 11.5 - 15.5 %   Platelets 194 150 - 400 K/uL   Neutrophils Relative % 49 %   Neutro Abs 3.2 1.7 - 7.7 K/uL   Lymphocytes Relative 37 %   Lymphs Abs 2.4 0.7 - 4.0 K/uL   Monocytes Relative 9 %   Monocytes Absolute 0.6 0.1 - 1.0 K/uL   Eosinophils Relative 4 %   Eosinophils Absolute 0.3 0.0 - 0.7 K/uL   Basophils Relative 1 %   Basophils Absolute 0.0 0.0 - 0.1 K/uL   No results found.  A comprehensive review of systems was negative.  Blood pressure 127/77, pulse 68, temperature 98 F (36.7 C), temperature source Oral, resp. rate 20, height 5' 11"  (1.803 m), weight 113.399 kg (250 lb), SpO2 95 %.  The patient is awake and alert. He is oriented and appropriate. Cranial nerve function intact. Motor and sensory function of the extremities reveals decreased sensation bilateral lower extremities primarily in a stocking distribution but also with some overlay of L5 and S1 sensory loss. Reflexes increased in both upper and lower extremities. Examination head ears eyes and throat is unremarkable. Chest and abdomen benign. Extremities free from injury or deformity. Assessment/Plan L3-4, L4-5 stenosis with bilateral lower extremity radiculopathy. Plan bilateral L3-4 and L4-5 decompressive laminotomies and foraminotomies. Risks and benefits of been explained. Patient wishes to proceed.   Gloris Shiroma A 12/20/2014, 7:39 AM

## 2014-12-20 NOTE — Op Note (Signed)
Date of procedure: 12/20/2014  Date of dictation: Same  Service: Neurosurgery  Preoperative diagnosis: L3-4, L4-5 stenosis with neurogenic claudication  Postoperative diagnosis: Same  Procedure Name: Bilateral L3-4 and L4-5 decompressive laminotomies and foraminotomies of the L3, L4 and L5 nerve roots  Surgeon:Karis Rilling A.Kazimir Hartnett, M.D.  Asst. Surgeon: Ditty  Anesthesia: General  Indication: 54 year old male with bilateral lower extremity pain numbness and paresthesias. Workup demonstrates evidence of severe stenosis at L3-4 and L4-5. Patient is failed conservative management. Patient presents now for decompressive surgery.  Operative note: After induction of anesthesia, patient position prone onto Wilson frame and a properly padded. Lumbar region prepped and draped. Incision made overlying L3-4 and 5. Dissection performed bilaterally. Retractor placed. X-ray taken. Levels confirmed. Decompressive laminotomies then performed using high-speed drill and Kerrison rongeurs to remove the inferior aspect of the lamina above the medial aspect the facet joint and the superior rim of the lamina below. Ligament flavum was elevated and resected piece L fashion. Decompressive foraminotomies were then completed on the course the L3-L4 and L5 nerve roots bilaterally. Findings were that of marked spondylitic change with ligamentous hypertrophy and significant lumbar epidural lipomatosis. Wound is then irrigated MI solution. Gelfoam was placed topically for hemostasis. Vancomycin powder was placed the deep wound space. Wounds and close in layers with Vicryls sutures. Steri-Strips sterile dressing were applied. No apparent complications. Patient tolerated the procedure well and he returns to the recovery room postop.

## 2014-12-20 NOTE — Plan of Care (Signed)
Problem: Consults Goal: Diagnosis - Spinal Surgery Outcome: Completed/Met Date Met:  12/20/14 Lumbar Laminectomy (Complex)     

## 2014-12-20 NOTE — Anesthesia Procedure Notes (Signed)
Procedure Name: Intubation Date/Time: 12/20/2014 8:38 AM Performed by: Fransisca Kaufmann Pre-anesthesia Checklist: Patient identified, Emergency Drugs available, Patient being monitored, Suction available and Timeout performed Patient Re-evaluated:Patient Re-evaluated prior to inductionOxygen Delivery Method: Circle system utilized Preoxygenation: Pre-oxygenation with 100% oxygen Intubation Type: IV induction Ventilation: Mask ventilation without difficulty Laryngoscope Size: Miller and 3 Grade View: Grade I Tube type: Oral Tube size: 8.0 mm Number of attempts: 1 Airway Equipment and Method: Stylet Placement Confirmation: ETT inserted through vocal cords under direct vision,  positive ETCO2 and breath sounds checked- equal and bilateral Secured at: 23 cm Tube secured with: Tape Dental Injury: Teeth and Oropharynx as per pre-operative assessment  Difficulty Due To: Difficulty was unanticipated

## 2014-12-20 NOTE — Anesthesia Postprocedure Evaluation (Signed)
  Anesthesia Post-op Note  Patient: Leroy Williams  Procedure(s) Performed: Procedure(s) (LRB): Lumbar Three-Four, Lumbar Four-Five Laminectomy (Bilateral)  Patient Location: PACU  Anesthesia Type: General  Level of Consciousness: awake and alert   Airway and Oxygen Therapy: Patient Spontanous Breathing  Post-op Pain: mild  Post-op Assessment: Post-op Vital signs reviewed, Patient's Cardiovascular Status Stable, Respiratory Function Stable, Patent Airway and No signs of Nausea or vomiting  Last Vitals:  Filed Vitals:   12/20/14 1309  BP: 138/83  Pulse: 102  Temp: 36.7 C  Resp: 20    Post-op Vital Signs: stable   Complications: No apparent anesthesia complications

## 2014-12-20 NOTE — Transfer of Care (Signed)
Immediate Anesthesia Transfer of Care Note  Patient: Leroy Williams  Procedure(s) Performed: Procedure(s): Lumbar Three-Four, Lumbar Four-Five Laminectomy (Bilateral)  Patient Location: PACU  Anesthesia Type:General  Level of Consciousness: awake, alert , oriented and sedated  Airway & Oxygen Therapy: Patient Spontanous Breathing, Patient connected to nasal cannula oxygen and Patient connected to face mask oxygen  Post-op Assessment: Report given to RN, Post -op Vital signs reviewed and stable and Patient moving all extremities  Post vital signs: Reviewed and stable  Last Vitals:  Filed Vitals:   12/20/14 0657  BP: 127/77  Pulse: 68  Temp: 36.7 C  Resp: 20    Complications: No apparent anesthesia complications

## 2014-12-21 ENCOUNTER — Encounter (HOSPITAL_COMMUNITY): Payer: Self-pay | Admitting: Neurosurgery

## 2014-12-21 ENCOUNTER — Other Ambulatory Visit: Payer: Self-pay

## 2014-12-21 DIAGNOSIS — M4806 Spinal stenosis, lumbar region: Secondary | ICD-10-CM | POA: Diagnosis not present

## 2014-12-21 LAB — GLUCOSE, CAPILLARY: GLUCOSE-CAPILLARY: 137 mg/dL — AB (ref 65–99)

## 2014-12-21 MED ORDER — METOPROLOL SUCCINATE ER 25 MG PO TB24: ORAL_TABLET | ORAL | Status: AC

## 2014-12-21 NOTE — Discharge Instructions (Signed)
Wound Care °Keep incision covered and dry for one week.  If you shower prior to then, cover incision with plastic wrap.  °You may remove outer bandage after one week and shower.  °Do not put any creams, lotions, or ointments on incision. °Leave steri-strips on neck.  They will fall off by themselves. °Activity °Walk each and every day, increasing distance each day. °No lifting greater than 5 lbs.  Avoid excessive neck motion. °No driving for 2 weeks; may ride as a passenger locally. °If provided with back brace, wear when out of bed.  It is not necessary to wear brace in bed. °Diet °Resume your normal diet.  °Return to Work °Will be discussed at you follow up appointment. °Call Your Doctor If Any of These Occur °Redness, drainage, or swelling at the wound.  °Temperature greater than 101 degrees. °Severe pain not relieved by pain medication. °Incision starts to come apart. °Follow Up Appt °Call today for appointment in 1-2 weeks ([phone]) or for problems.  If you have any hardware placed in your spine, you will need an x-ray before your appointment. °

## 2014-12-21 NOTE — Progress Notes (Signed)
Pt and wife given D/C instructions with verbal understanding. Pt's incision is clean and dry with no sign of infection. Pt's IV was removed prior to D/C. Pt D/C'd home via wheelchair @ 1025 per MD order. Pt is stable @ D/C and has no other needs at this time. Rema Fendt, RN

## 2014-12-21 NOTE — Discharge Summary (Signed)
Physician Discharge Summary  Patient ID: Leroy Williams MRN: [id number] DOB/AGE: [birth year] 54 y.o.  Admit date: 12/20/2014 Discharge date: 12/21/2014  Admission Diagnoses:  Discharge Diagnoses:  Principal Problem:   Spinal stenosis, lumbar region, with neurogenic claudication Active Problems:   Lumbar stenosis   Discharged Condition: good  Hospital Course: The patient was admitted the hospital where he underwent an uncomplicated two-level lumbar decompression. Postoperatively he is doing very well. Preoperative back and lower extremity pain are much improved. Standing and walking better. Happy with progress.  Consults:   Significant Diagnostic Studies:   Treatments:   Discharge Exam: Blood pressure 107/70, pulse 76, temperature 97.6 F (36.4 C), temperature source Oral, resp. rate 18, height  (1.803 m), weight 113.399 kg (250 lb), SpO2 93 %. Awake and alert. Oriented and appropriate. Cranial nerve function intact. Motor and sensory function of the upper extremities normal. Motor examination lower extremities normal. Still with some mild stocking sensory loss in both feet but improved from preop. Wound clean and dry. Chest and abdomen benign.  Disposition: Final discharge disposition not confirmed     Medication List    TAKE these medications        aspirin 81 MG chewable tablet  Chew 81 mg by mouth daily.     aspirin-acetaminophen-caffeine 250-250-65 MG per tablet  Commonly known as:  EXCEDRIN MIGRAINE  Take 1 tablet by mouth every 6 (six) hours as needed for headache.     atorvastatin 10 MG tablet  Commonly known as:  LIPITOR  Take 10 mg by mouth daily at 6 PM.     buPROPion 300 MG 24 hr tablet  Commonly known as:  WELLBUTRIN XL  Take 300 mg by mouth daily.     diazepam 5 MG tablet  Commonly known as:  VALIUM  Take 5 mg by mouth as needed (MRI ansd such).     DULoxetine 60 MG capsule  Commonly known as:  CYMBALTA  Take 60 mg by mouth 2 (two) times daily.      gabapentin 600 MG tablet  Commonly known as:  NEURONTIN  Take four times daily.     HYDROmorphone 2 MG tablet  Commonly known as:  DILAUDID  Take 2 mg by mouth as needed for moderate pain or severe pain.     indapamide 1.25 MG tablet  Commonly known as:  LOZOL  Take 1.25 mg by mouth daily.     l-methylfolate-B6-B12 3-35-2 MG Tabs  Commonly known as:  METANX  Take 1 tablet by mouth 2 (two) times daily between meals.     Magnesium 250 MG Tabs  Take 500 mg by mouth daily.     metFORMIN 500 MG tablet  Commonly known as:  GLUCOPHAGE  Take 500 mg by mouth 2 (two) times daily with a meal.     metoprolol succinate 25 MG 24 hr tablet  Commonly known as:  TOPROL-XL  TAKE 1 TABLET AT LUNCH AND 1 TABLET EACH NIGHT     Omega-3 1000 MG Caps  Take 1 g by mouth daily.     orphenadrine 100 MG tablet  Commonly known as:  NORFLEX  TAKE 1 TABLET (100 MG TOTAL) BY MOUTH 2 (TWO) TIMES DAILY AS NEEDED FOR MUSCLE SPASMS.     oxyCODONE 15 MG immediate release tablet  Commonly known as:  ROXICODONE  Take 15 mg by mouth 5 (five) times daily as needed for pain. 5 times a day as needed     OXYCONTIN 10 mg T12a 12  hr tablet  Generic drug:  OxyCODONE  Take 30 mg by mouth every 12 (twelve) hours as needed (pain). As needed     potassium citrate 10 MEQ (1080 MG) SR tablet  Commonly known as:  UROCIT-K  Take 20 mEq by mouth 2 (two) times daily.     pramipexole 0.5 MG tablet  Commonly known as:  MIRAPEX  Take 1 tablet (0.5 mg total) by mouth 3 (three) times daily.     pregabalin 150 MG capsule  Commonly known as:  LYRICA  Take 150 mg by mouth 3 (three) times daily.     pregabalin 100 MG capsule  Commonly known as:  LYRICA  Take 1 capsule (100 mg total) by mouth 3 (three) times daily.     PRILOSEC OTC 20 MG tablet  Generic drug:  omeprazole  daily.     promethazine 50 MG tablet  Commonly known as:  PHENERGAN  TAKE 1 TABLET (50 MG TOTAL) BY MOUTH EVERY 6 (SIX) HOURS AS NEEDED FOR  NAUSEA OR VOMITING.     tamsulosin 0.4 MG Caps capsule  Commonly known as:  FLOMAX  Take 0.4 mg by mouth daily after breakfast.     testosterone cypionate 200 MG/ML injection  Commonly known as:  DEPOTESTOSTERONE CYPIONATE  Inject 100 mg into the muscle every 28 (twenty-eight) days. Injection     tiZANidine 4 MG capsule  Commonly known as:  ZANAFLEX  TAKE 1 CAPSULE (4 MG TOTAL) BY MOUTH 3 (THREE) TIMES DAILY AS NEEDED FOR MUSCLE SPASMS.     VITAMIN D-1000 MAX ST 1000 UNITS tablet  Generic drug:  Cholecalciferol  Take 1,000 Units by mouth daily.           Follow-up Information    Follow up with Temple Pacini, MD.   Specialty:  Neurosurgery   Contact information:   [address] [place] [address] [phone]       Signed: Temple Pacini 12/21/2014, 9:41 AM

## 2016-03-12 IMAGING — MR MR THORACIC SPINE W/O CM
5 of 10 series · 24 of 48 positions shown · non-contrast
Comparison: Lumbar MRI 09/05/2014.

CLINICAL DATA: 54-year-old male with severe back and nerve pain.
Numbness in both hands and feet. Urinary frequency. Myelopathy.
Subsequent encounter.

EXAM:
MRI CERVICAL AND THORACIC SPINE WITHOUT CONTRAST
TECHNIQUE: Multiplanar and multiecho pulse sequences of the cervical spine, to
include the craniocervical junction and cervicothoracic junction,
and thoracic spine, were obtained without intravenous contrast.

[Series 3: T2 · sagittal · 3.0mm · 0.66mm/px · 3 of 12 slices shown (1 of 4)]
[im 1/12]
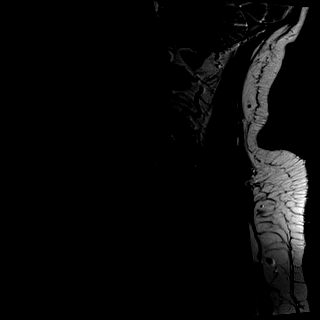
[im 6/12]
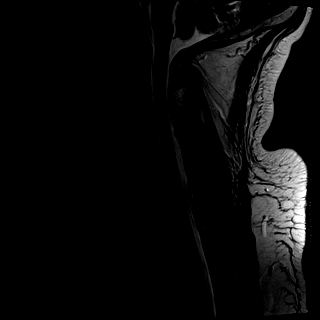
[im 12/12]
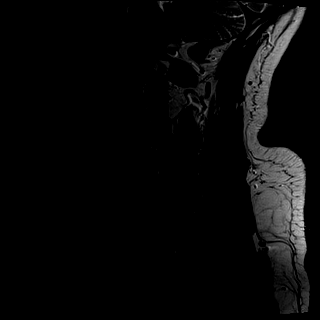

[Series 4: T1 · sagittal · 3.0mm · 0.41mm/px · 3 of 12 slices shown]
[im 1/12]
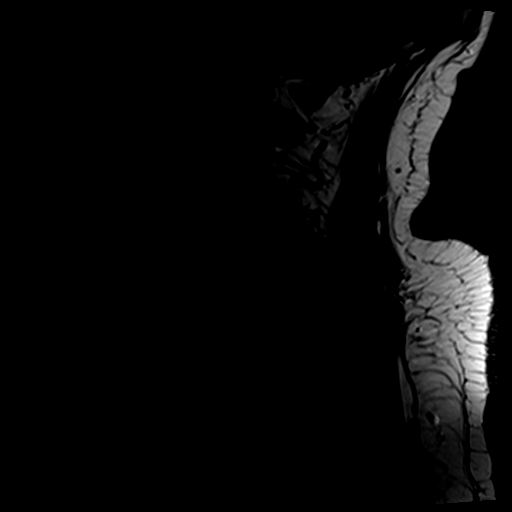
[im 6/12]
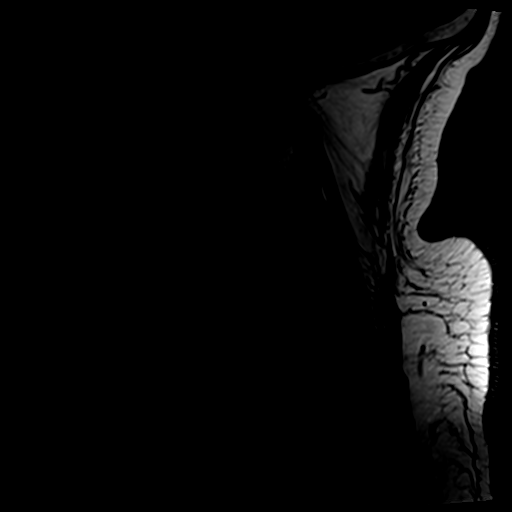
[im 12/12]
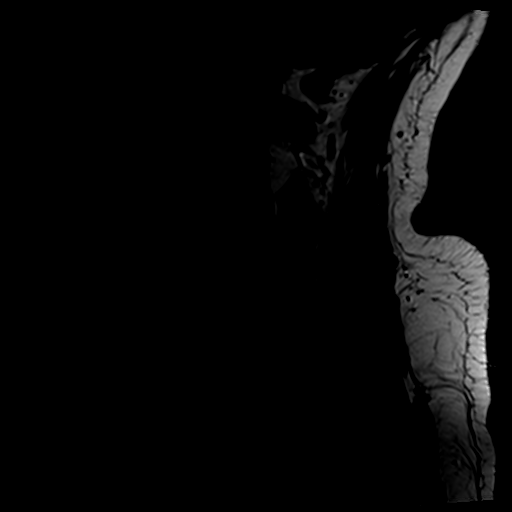

[Series 7: T2 · axial · 3.0mm · 0.70mm/px · z∈[-75,+38]mm · 8 of 32 slices shown (2 of 4)]
[im 1/32]
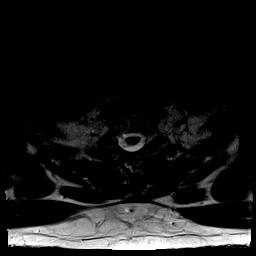
[im 5/32]
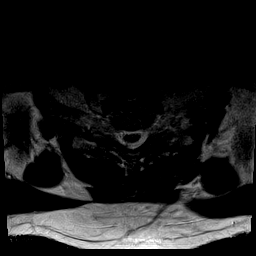
[im 9/32]
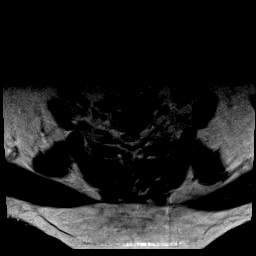
[im 14/32]
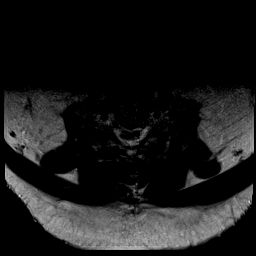
[im 18/32]
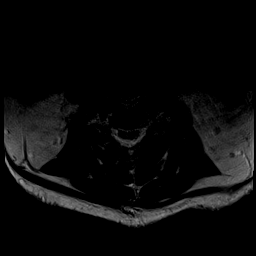
[im 23/32]
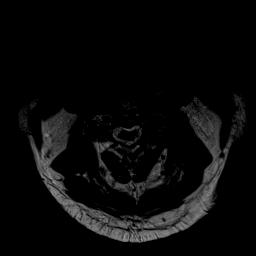
[im 27/32]
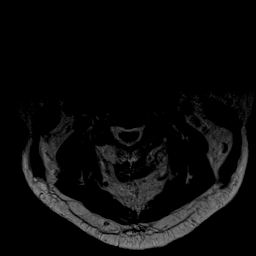
[im 32/32]
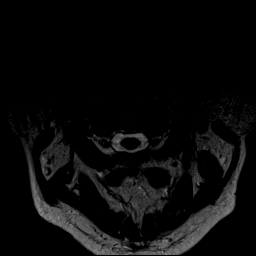

[Series 14: T2 · sagittal · 3.0mm · 0.56mm/px · 3 of 14 slices shown (3 of 4)]
[im 1/14]
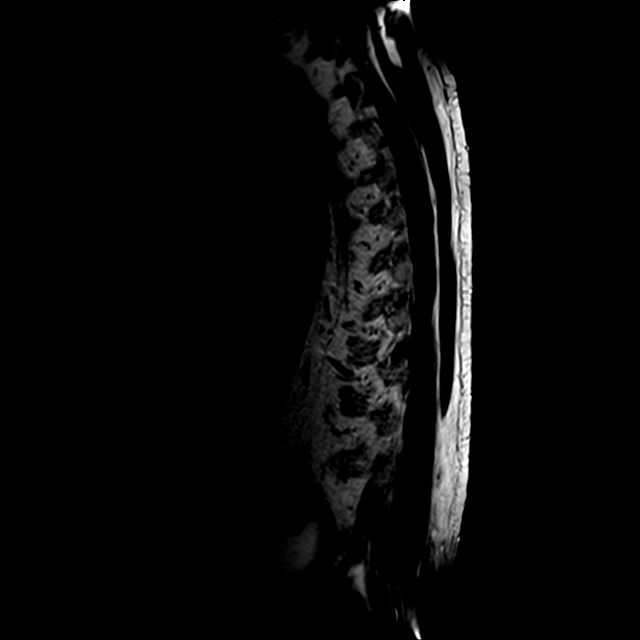
[im 7/14]
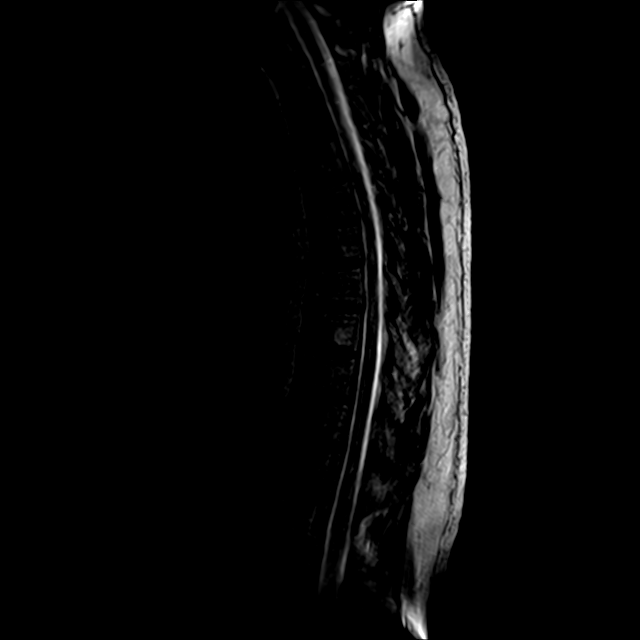
[im 14/14]
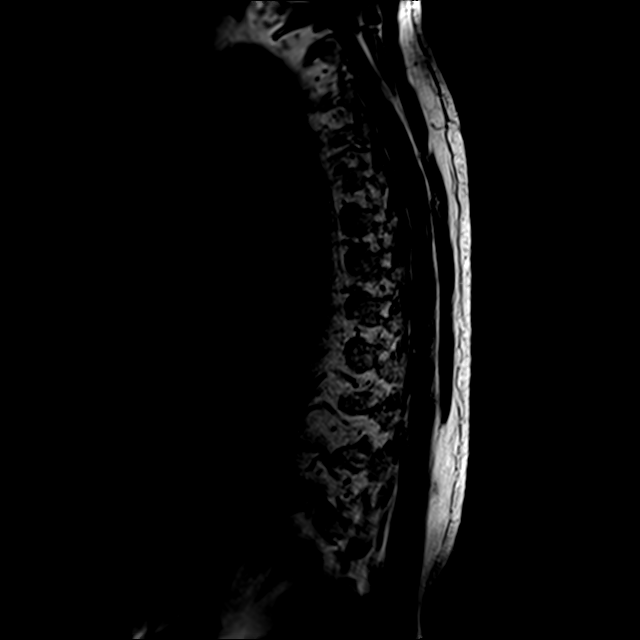

[Series 15: T2 · axial · 3.5mm · 0.39mm/px · z∈[-313,-47]mm · 7 of 31 slices shown (4 of 4)]
[im 1/31]
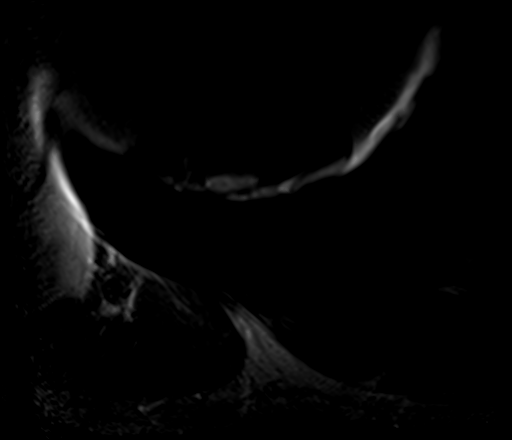
[im 6/31]
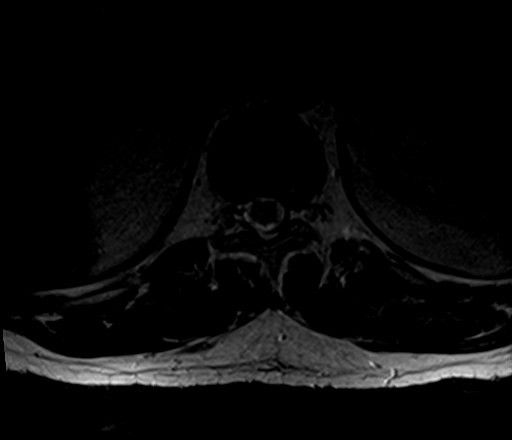
[im 11/31]
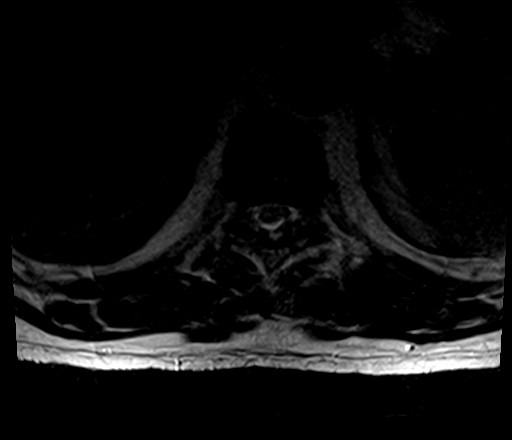
[im 16/31]
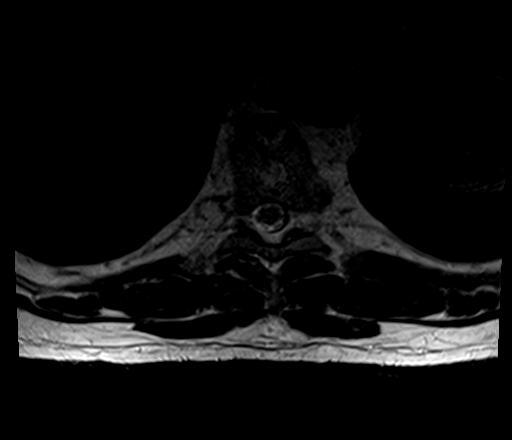
[im 21/31]
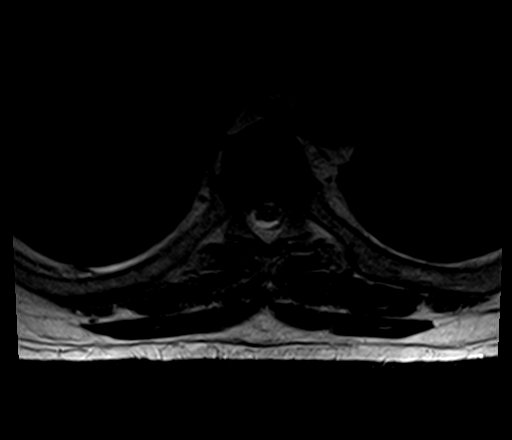
[im 26/31]
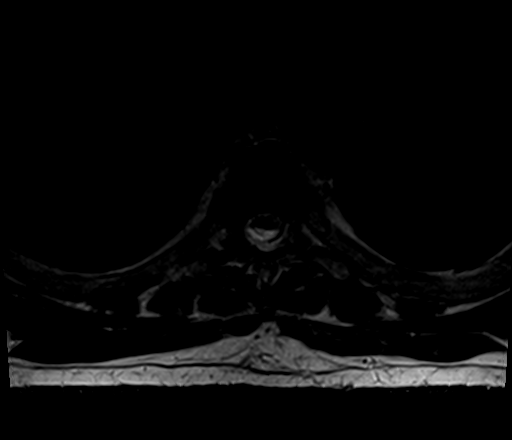
[im 31/31]
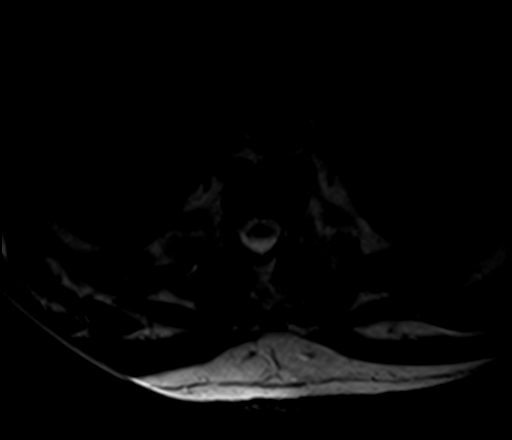

[24 of 48 positions shown; findings below may reference images not displayed]

FINDINGS: Study is intermittently degraded by motion artifact despite repeated
imaging attempts.

MRI CERVICAL SPINE FINDINGS

Straightening of cervical lordosis. Trace anterolisthesis at C3-C4
and C4-C5. There is mild or trace marrow edema associated with the
left C4-C5 facet joint (series 5, image 11). No other marrow edema
or acute osseous abnormality identified.

No cervical spinal cord signal abnormality identified.

Negative paraspinal soft tissues.

C2-C3: Moderate facet hypertrophy and mild uncovertebral hypertrophy
on the left. Mild to moderate left C3 foraminal stenosis.

C3-C4: Moderate facet hypertrophy greater on the left. Mild to
moderate left C4 foraminal stenosis.

C4-C5: Mild disc bulge. Mild to moderate facet hypertrophy.
Borderline to mild C5 foraminal stenosis.

C5-C6:  Negative.

C6-C7: Central disc herniation with caudal and right paracentral
migration of disc best seen on series 3, image 5. Narrowing of the
ventral CSF space. The dorsal CSF space appears to remain patent
with no definite spinal cord mass effect, but overall there is
borderline to mild spinal stenosis. Borderline to mild C7 foraminal
stenosis.

C7-T1:  Negative.

Cervicomedullary junction is within normal limits.

MRI THORACIC SPINE FINDINGS

Thoracic vertebral height and alignment are within normal limits.
There is a small benign hemangioma in the T8 vertebral body. This
numbering system appears concordant with the lumbar MRI from [facility].
No marrow edema or evidence of acute osseous abnormality.

Mild thoracic epidural lipomatosis, maximal at T7-T8. Still despite
this and the degenerative changes described below there is no
significant thoracic spinal stenosis, no thoracic cord compression.
No thoracic spinal cord signal abnormality is identified.

Negative visualized thoracic and upper abdominal viscera

T1-T2: Mild facet hypertrophy on the left. Mild left T2 foraminal
stenosis.

T2-T3: Mild facet and uncovertebral hypertrophy. Borderline to mild
T2 foraminal stenosis.

T3-T4: Mild facet hypertrophy on the right, no stenosis.

T4-T5: Negative.

T5-T6: Negative.

T6-T7: Negative.

T7-T8: Small central disc protrusion.  No significant stenosis.

T8-T9: Negative.

T9-T10: Mild facet hypertrophy.  No significant stenosis.

T10-T11: Moderate facet hypertrophy. Still, no significant stenosis.

T11-T12: Mild facet hypertrophy.  No stenosis.
IMPRESSION: 1. C6-C7 caudal disc herniation directed to the right. Borderline to
mild spinal stenosis at that level. No cord mass effect or signal
abnormality.
2. Mild spondylolisthesis in the upper cervical spine associated
with moderate facet degeneration at C2-C3, C3-C4, C4-C5. Up to
moderate left C3 and C4 foraminal stenosis results, and there is
mild acute exacerbation of the chronic facet joint arthritis on the
left at C4-C5.
3. Comparatively mild thoracic spine degeneration, and no thoracic
spinal stenosis despite epidural lipomatosis and isolated tiny
thoracic disc protrusion at T7-T8. No thoracic cord signal
abnormality identified.

## 2019-04-26 NOTE — Nursing Note (Signed)
 Adult Admission Assessment - Text       Perioperative Admission Assessment Entered On:  04/26/2019 14:46 EST    Performed On:  04/26/2019 14:30 EST by THELMA, RN, SHERRILYN E               General   Preferred Communication Mode :   Verbal   Schutte, RN, Marifrasier C - 05/03/2019 12:08 EST   Call Complete :   04/27/2019 16:13 EST   THELMA, RN, HELEN E - 04/27/2019 16:04 EST   Call Start :   04/27/2019 15:41 EST   FISCHER, RN, HELEN E - 04/27/2019 15:41 EST       Information Given By :   Self   Height/Length Estimated :   180 cm(Converted to: 5 ft 11 in, 5.91 ft, 70.87 in)    [place], RN, HELEN E - 04/26/2019 14:30 EST   BMI   Estimated :   117.4 kg(Converted to: 258 lb 13 oz, 258.823 lb)      Body Mass Index Estimated :   36.23 kg/m2   [place], RN, Marifrasier C - 05/03/2019 12:08 EST     Primary Care Physician/Specialists :   Dr KATHEE Blush, [place], [place] // Dr JINNY Mower // [facility], Dr SHAUNNA Fabian // Dr Aleene Norfolk //   Emergency Contact Name :   [name], spouse, [phone]   Languages :   Isadora THELMA, RN, HELEN E - 04/26/2019 14:30 EST   Allergies   (As Of: 05/03/2019 12:16:12 EST)   Allergies (Active)   codeine  Estimated Onset Date:   Unspecified ; Reactions:   Itching ; Created By:   THELMA, RN, HELEN E; Reaction Status:   Active ; Category:   Drug ; Substance:   codeine ; Type:   Allergy ; Severity:   Moderate ; Updated By:   THELMA, RN, SHERRILYN BRAVO; Reviewed Date:   05/03/2019 12:09 EST      iodinated radiocontrast dyes  Estimated Onset Date:   Unspecified ; Reactions:   Chest pain, Fever ; Created By:   THELMA, RN, HELEN E; Reaction Status:   Active ; Category:   Drug ; Substance:   iodinated radiocontrast dyes ; Type:   Allergy ; Severity:   Severe ; Updated By:   THELMA RN, SHERRILYN BRAVO; Reviewed Date:   05/03/2019 12:09 EST        Medication History   Medication List   (As Of: 05/03/2019 12:16:13 EST)   Normal Order    Lactated Ringers Injection solution 1,000 mL  :   Lactated Ringers  Injection solution 1,000 mL ; Status:   Ordered ; Ordered As Mnemonic:   Lactated Ringers Injection 1,000 mL ; Simple Display Line:   40 mL/hr, IV ; Ordering Provider:   GEVENA NORLEEN LABOR; Catalog Code:   Lactated Ringers Injection ; Order Dt/Tm:   05/03/2019 12:04:19 EST ; Comment:   Perioperative use ONLY  For Non Dialysis Patient          Sodium Chloride 0.9% intravenous solution 500 mL  :   Sodium Chloride 0.9% intravenous solution 500 mL ; Status:   Ordered ; Ordered As Mnemonic:   Sodium Chloride 0.9% Continuous 500 mL ; Simple Display Line:   50 mL/hr, IV ; Ordering Provider:   GEVENA NORLEEN A; Catalog Code:   Sodium Chloride 0.9% ; Order Dt/Tm:   05/03/2019 12:04:19 EST ; Comment:   For dialysis patient  Home Meds    cholecalciferol  :   cholecalciferol ; Status:   Documented ; Ordered As Mnemonic:   Vitamin D3 ; Simple Display Line:   1,000 International_unit, 0 Refill(s) ; Catalog Code:   cholecalciferol ; Order Dt/Tm:   04/27/2019 16:06:04 EST          cyanocobalamin  :   cyanocobalamin ; Status:   Documented ; Ordered As Mnemonic:   Vitamin B12 oral tablet ; Simple Display Line:   0 Refill(s) ; Catalog Code:   cyanocobalamin ; Order Dt/Tm:   04/27/2019 16:05:59 EST          magnesium oxide  :   magnesium oxide ; Status:   Documented ; Ordered As Mnemonic:   magnesium oxide 250 mg oral tablet ; Simple Display Line:   mg, tabs, Oral, Daily, 0 Refill(s) ; Catalog Code:   magnesium oxide ; Order Dt/Tm:   04/27/2019 16:05:45 EST          Misc Medication  :   Misc Medication ; Status:   Documented ; Ordered As Mnemonic:   Testosterone transdermal drops ; Simple Display Line:   Daily, 0 Refill(s) ; Catalog Code:   Misc Medication ; Order Dt/Tm:   04/27/2019 16:05:02 EST          omega-3 polyunsaturated fatty acids  :   omega-3 polyunsaturated fatty acids ; Status:   Documented ; Ordered As Mnemonic:   Fish Oil oral capsule ; Simple Display Line:   1 caps, Oral, Daily, 100 caps, 0 Refill(s) ; Catalog  Code:   omega-3 polyunsaturated fatty acids ; Order Dt/Tm:   04/27/2019 16:05:34 EST          oxyCODONE  :   oxyCODONE ; Status:   Documented ; Ordered As Mnemonic:   Xtampza ER 18 mg oral capsule, extended release ; Simple Display Line:   18 mg, 1 caps, Oral, q12hr, TAKE 1 CAPSULE BY ORAL ROUTE EVERY 12 HOURS ( DNF UNTIL 03/18/2019) ; Catalog Code:   oxyCODONE ; Order Dt/Tm:   04/27/2019 15:51:05 EST          HYDROmorphone  :   HYDROmorphone ; Status:   Documented ; Ordered As Mnemonic:   HYDROmorphone 4 mg oral tablet ; Simple Display Line:   4 mg, 1 tabs, Oral, q4hr, PRN, 0 Refill(s) ; Catalog Code:   HYDROmorphone ; Order Dt/Tm:   04/27/2019 15:51:05 EST ; Comment:    THIS MEDICATION IS ASSOCIATED   WITH   AN INCREASED RISK OF FALLS.          orphenadrine  :   orphenadrine ; Status:   Documented ; Ordered As Mnemonic:   orphenadrine 100 mg oral tablet, extended release ; Simple Display Line:   100 mg, 1 tabs, Oral, BID, for 10 days, PRN: migraine headache, 20 tabs, 0 Refill(s) ; Catalog Code:   orphenadrine ; Order Dt/Tm:   04/27/2019 15:51:05 EST          acetaminophen-oxyCODONE  :   acetaminophen-oxyCODONE ; Status:   Documented ; Ordered As Mnemonic:   oxyCODONE-acetaminophen 10 mg-325 mg oral tablet ; Simple Display Line:   1 tabs, Oral, q6hr, PRN: severe pain (8-10), 0 Refill(s) ; Catalog Code:   acetaminophen-oxyCODONE ; Order Dt/Tm:   04/27/2019 15:48:50 EST ; Comment:   MAX DAILY DOSE OF ACETAMINOPHEN = 4000 MG          omeprazole  :   omeprazole ; Status:   Documented ;  Ordered As Mnemonic:   omeprazole 40 mg oral delayed release capsule ; Simple Display Line:   40 mg, 1 caps, Oral, Daily, 0 Refill(s) ; Catalog Code:   omeprazole ; Order Dt/Tm:   04/27/2019 15:48:50 EST          atorvastatin  :   atorvastatin ; Status:   Documented ; Ordered As Mnemonic:   atorvastatin 10 mg oral tablet ; Simple Display Line:   10 mg, 1 tabs, Oral, At Bedtime (Once a Day), 0 Refill(s) ; Catalog Code:   atorvastatin ; Order  Dt/Tm:   04/27/2019 15:48:50 EST          DULoxetine  :   DULoxetine ; Status:   Documented ; Ordered As Mnemonic:   DULoxetine 60 mg oral delayed release capsule ; Simple Display Line:   60 mg, 1 caps, Oral, BID, 0 Refill(s) ; Catalog Code:   DULoxetine ; Order Dt/Tm:   04/27/2019 15:48:50 EST          metFORMIN  :   metFORMIN ; Status:   Documented ; Ordered As Mnemonic:   metFORMIN 500 mg oral tablet ; Simple Display Line:   500 mg, 1 tabs, Oral, BID, 180 tabs, 0 Refill(s) ; Catalog Code:   metFORMIN ; Order Dt/Tm:   04/27/2019 15:48:50 EST          potassium citrate  :   potassium citrate ; Status:   Documented ; Ordered As Mnemonic:   potassium citrate 10 mEq oral tablet, extended release ; Simple Display Line:   10 mEq, 1 tabs, Oral, TID, 90 tabs, 0 Refill(s) ; Catalog Code:   potassium citrate ; Order Dt/Tm:   04/27/2019 15:48:50 EST          metoprolol  :   metoprolol ; Status:   Documented ; Ordered As Mnemonic:   Metoprolol Tartrate 25 mg oral tablet ; Simple Display Line:   25 mg, 1 tabs, Oral, BID, 0 Refill(s) ; Catalog Code:   metoprolol ; Order Dt/Tm:   04/27/2019 15:48:50 EST          pregabalin  :   pregabalin ; Status:   Documented ; Ordered As Mnemonic:   pregabalin 150 mg oral capsule ; Simple Display Line:   150 mg, 1 caps, Oral, QID, 0 Refill(s) ; Catalog Code:   pregabalin ; Order Dt/Tm:   04/27/2019 15:48:50 EST          buPROPion  :   buPROPion ; Status:   Documented ; Ordered As Mnemonic:   buPROPion 300 mg/24 hours (XL) oral tablet, extended release ; Simple Display Line:   300 mg, 1 tabs, Oral, qAM, 30 tabs, 0 Refill(s) ; Catalog Code:   buPROPion ; Order Dt/Tm:   04/27/2019 15:48:50 EST ; Comment:    SWALLOW WHOLE, DO NOT CHEW,   BREAK, OR CRUSH.          pramipexole  :   pramipexole ; Status:   Documented ; Ordered As Mnemonic:   pramipexole 0.25 mg oral tablet ; Simple Display Line:   0.5 mg, 2 tabs, Oral, qPM, 270 tabs, 0 Refill(s) ; Catalog Code:   pramipexole ; Order Dt/Tm:   04/27/2019  15:48:50 EST          tamsulosin  :   tamsulosin ; Status:   Documented ; Ordered As Mnemonic:   tamsulosin 0.4 mg oral capsule ; Simple Display Line:   0.4 mg, 1 caps, Oral, qAM, 30 caps, 0  Refill(s) ; Catalog Code:   tamsulosin ; Order Dt/Tm:   04/27/2019 15:48:50 EST          indapamide  :   indapamide ; Status:   Documented ; Ordered As Mnemonic:   indapamide 1.25 mg oral tablet ; Simple Display Line:   1.25 mg, 1 tabs, Oral, qAM, 30 tabs, 0 Refill(s) ; Catalog Code:   indapamide ; Order Dt/Tm:   04/27/2019 15:48:50 EST            Problem History   (As Of: 05/03/2019 12:16:13 EST)   Problems(Active)    Acid reflux (SNOMED CT  :646864985 )  Name of Problem:   Acid reflux ; Recorder:   THELMA, RN, HELEN E; Confirmation:   Confirmed ; Classification:   Patient Stated ; Code:   646864985 ; Contributor System:   PowerChart ; Last Updated:   04/26/2019 14:38 EST ; Life Cycle Date:   04/26/2019 ; Life Cycle Status:   Active ; Vocabulary:   SNOMED CT        BPH (benign prostatic hyperplasia) (SNOMED CT  :603268987 )  Name of Problem:   BPH (benign prostatic hyperplasia) ; Recorder:   THELMA, RN, HELEN E; Confirmation:   Confirmed ; Classification:   Patient Stated ; Code:   603268987 ; Contributor System:   PowerChart ; Last Updated:   04/26/2019 14:38 EST ; Life Cycle Date:   04/26/2019 ; Life Cycle Status:   Active ; Vocabulary:   SNOMED CT        DM2 (diabetes mellitus, type 2) (SNOMED CT  :802238985 )  Name of Problem:   DM2 (diabetes mellitus, type 2) ; Recorder:   THELMA, RN, HELEN E; Confirmation:   Confirmed ; Classification:   Patient Stated ; Code:   802238985 ; Contributor System:   PowerChart ; Last Updated:   04/26/2019 14:40 EST ; Life Cycle Date:   04/26/2019 ; Life Cycle Status:   Active ; Vocabulary:   SNOMED CT        High cholesterol (SNOMED CT  :76716984 )  Name of Problem:   High cholesterol ; Recorder:   FISCHER, RN, HELEN E; Confirmation:   Confirmed ; Classification:   Patient Stated ; Code:   76716984  ; Contributor System:   PowerChart ; Last Updated:   04/26/2019 14:35 EST ; Life Cycle Date:   04/26/2019 ; Life Cycle Status:   Active ; Vocabulary:   SNOMED CT        HTN (hypertension) (SNOMED CT  :8784255987 )  Name of Problem:   HTN (hypertension) ; Recorder:   THELMA, RN, HELEN E; Confirmation:   Confirmed ; Classification:   Patient Stated ; Code:   8784255987 ; Contributor System:   PowerChart ; Last Updated:   04/26/2019 14:36 EST ; Life Cycle Date:   04/26/2019 ; Life Cycle Status:   Active ; Vocabulary:   SNOMED CT        Joint pain (SNOMED CT  :04089989 )  Name of Problem:   Joint pain ; Recorder:   FISCHER, RN, HELEN E; Confirmation:   Confirmed ; Classification:   Patient Stated ; Code:   04089989 ; Contributor System:   PowerChart ; Last Updated:   04/26/2019 14:39 EST ; Life Cycle Date:   04/26/2019 ; Life Cycle Status:   Active ; Vocabulary:   SNOMED CT        Kidney stones (SNOMED CT  :841703981 )  Name of Problem:  Kidney stones ; Recorder:   THELMA, RN, HELEN E; Confirmation:   Confirmed ; Classification:   Patient Stated ; Code:   841703981 ; Contributor System:   PowerChart ; Last Updated:   04/26/2019 14:35 EST ; Life Cycle Date:   04/26/2019 ; Life Cycle Status:   Active ; Vocabulary:   SNOMED CT        Lumbar back pain (SNOMED CT  :583860989 )  Name of Problem:   Lumbar back pain ; Recorder:   FISCHER, RN, HELEN E; Confirmation:   Confirmed ; Classification:   Patient Stated ; Code:   583860989 ; Contributor System:   PowerChart ; Last Updated:   04/26/2019 14:39 EST ; Life Cycle Date:   04/26/2019 ; Life Cycle Status:   Active ; Vocabulary:   SNOMED CT        Migraine headache (SNOMED CT  :36944985 )  Name of Problem:   Migraine headache ; Recorder:   FISCHER, RN, HELEN E; Confirmation:   Confirmed ; Classification:   Patient Stated ; Code:   36944985 ; Contributor System:   PowerChart ; Last Updated:   04/26/2019 14:41 EST ; Life Cycle Date:   04/26/2019 ; Life Cycle Status:   Active ; Vocabulary:    SNOMED CT        Neuropathy (SNOMED CT  :8519779981 )  Name of Problem:   Neuropathy ; Recorder:   FISCHER, RN, HELEN E; Confirmation:   Confirmed ; Classification:   Patient Stated ; Code:   8519779981 ; Contributor System:   PowerChart ; Last Updated:   04/26/2019 14:35 EST ; Life Cycle Status:   Active ; Vocabulary:   SNOMED CT   ; Comments:        04/26/2019 14:35 - FISCHER, RN, HELEN E  hands and feet      OSA on CPAP (SNOMED CT  :870110984 )  Name of Problem:   OSA on CPAP ; Recorder:   FISCHER, RN, HELEN E; Confirmation:   Confirmed ; Classification:   Patient Stated ; Code:   870110984 ; Contributor System:   PowerChart ; Last Updated:   04/26/2019 14:36 EST ; Life Cycle Date:   04/26/2019 ; Life Cycle Status:   Active ; Vocabulary:   SNOMED CT        Spinal stenosis, lumbar (SNOMED CT  :69022983 )  Name of Problem:   Spinal stenosis, lumbar ; Recorder:   FISCHER, RN, HELEN E; Confirmation:   Confirmed ; Classification:   Patient Stated ; Code:   69022983 ; Contributor System:   PowerChart ; Last Updated:   04/26/2019 14:39 EST ; Life Cycle Date:   04/26/2019 ; Life Cycle Status:   Active ; Vocabulary:   SNOMED CT          Diagnoses(Active)    Encounter for screening for other viral diseases  Date:   04/27/2019 ; Confirmation:   Confirmed ; Clinical Dx:   Encounter for screening for other viral diseases ; Classification:   Medical ; Clinical Service:   Non-Specified ; Code:   ICD-10-CM ; Probability:   0 ; Diagnosis Code:   Z11.59      Encounter for therapeutic drug level monitoring  Date:   04/27/2019 ; Confirmation:   Confirmed ; Clinical Dx:   Encounter for therapeutic drug level monitoring ; Classification:   Medical ; Clinical Service:   Non-Specified ; Code:   ICD-10-CM ; Probability:   0 ; Diagnosis Code:   Z51.81  Other long term (current) drug therapy  Date:   04/27/2019 ; Confirmation:   Confirmed ; Clinical Dx:   Other long term (current) drug therapy ; Classification:   Medical ; Clinical Service:    Non-Specified ; Code:   ICD-10-CM ; Probability:   0 ; Diagnosis Code:   Z79.899        Procedure History        -    Procedure History   (As Of: 05/03/2019 12:16:13 EST)     Anesthesia Minutes:   0 ; Procedure Name:   Lithotripsy ; Procedure Minutes:   0 ; Comments:     04/27/2019 15:52 EST - THELMA, RN, HELEN E  x20 ; Last Reviewed Dt/Tm:   05/03/2019 12:15:28 EST            Anesthesia Minutes:   0 ; Procedure Name:   Deviated septum repair ; Procedure Minutes:   0 ; Last Reviewed Dt/Tm:   05/03/2019 12:15:28 EST            Anesthesia Minutes:   0 ; Procedure Name:   TURP - Transurethral resection of prostate ; Procedure Minutes:   0 ; Last Reviewed Dt/Tm:   05/03/2019 12:15:28 EST            Anesthesia Minutes:   0 ; Procedure Name:   Basket retrieval of kidney stones x9 ; Procedure Minutes:   0 ; Last Reviewed Dt/Tm:   05/03/2019 12:15:28 EST            Anesthesia Minutes:   0 ; Procedure Name:   Urethra repair ; Procedure Minutes:   0 ; Comments:     04/27/2019 15:53 EST - THELMA, RN, HELEN E  after injury from catheter ; Last Reviewed Dt/Tm:   05/03/2019 12:15:28 EST            Anesthesia Minutes:   0 ; Procedure Name:   Anal fissure repair ; Procedure Minutes:   0 ; Last Reviewed Dt/Tm:   05/03/2019 12:15:28 EST            History Confirmation   Problem History Changes PAT :   No   Procedure History Changes PAT :   No   Schutte, RN, Marifrasier C - 05/03/2019 12:08 EST   Anesthesia/Sedation   Pregnancy Status :   N/A   THELMA, RN, HELEN E - 04/27/2019 16:04 EST   Anesthesia History :   Prior general anesthesia   SN - Malignant Hyperthermia :   Denies   Previous Problem with Anesthesia :   None   Moderate Sedation History :   Prior sedation for procedure   Previous Problem With Sedation :   None   Shortness of Breath Indicator :   No shortness of breath   THELMA, RN, HELEN E - 04/27/2019 15:41 EST   Bloodless Medicine   Is Blood Transfusion Acceptable to Patient :   Yes   FISCHER, RN, HELEN E - 04/27/2019 15:41 EST    ID Risk Screen Symptoms   Recent Travel History :   No recent travel   Close Contact with COVID-19 ID :   Preadmission testing patients only   FISCHER, RN, HELEN E - 04/27/2019 15:41 EST   Last 14 days COVID-19 ID :   Yes - Not Detected (negative)   Schutte, RN, Marifrasier C - 05/03/2019 12:08 EST       TB Symptom Screen :   No symptoms   C. diff Symptom/History  ID :   Neither of the above   [place], RN, HELEN E - 04/27/2019 15:41 EST   ID COVID-19 Screen   Fever OR Chills :   No   Headache :   No   New or Worsening Cough :   No   Fatigue :   No   Shortness of Breath ID :   No   Myalgia (Muscle Pain) :   No   Dyspnea :   No   Diarrhea :   No   Sore Throat :   No   Nausea :   No   Laryngitis :   No   Sudden Loss of Taste or Smell :   No   FISCHER, RN, HELEN E - 04/27/2019 15:41 EST   Social History   Social History   (As Of: 05/03/2019 12:16:13 EST)   Tobacco:        Tobacco use: Never (less than 100 in lifetime).   (Last Updated: 04/27/2019 15:55:17 EST by THELMA, RN, HELEN E)          Electronic Cigarette/Vaping:        Never Electronic Cigarette Use.   (Last Updated: 04/27/2019 15:55:20 EST by THELMA, RN, HELEN E)          Alcohol:        Current, 1-2 times per year   (Last Updated: 04/27/2019 15:55:31 EST by THELMA, RN, HELEN E)          Substance Use:        Opioid Complex, Current, Prescription medications   (Last Updated: 04/27/2019 15:55:44 EST by THELMA, RN, HELEN E)            Advance Directive   Advance Directive :   Yes   Type of Advance Directive :   Living will, Medical durable power of attorney   [place], RN, [place] E - 04/27/2019 15:41 EST   PAT Patient Instructions   Patient Arrival Time PAT :   05/03/2019 0:00 EST   Medications in AM :   bupropion, duloxetine, metoprolol, omeprazole, potassium, pregabalin, tamsulosin   Medication Understanding :   Verbalizes understanding   NPO PAT :   NPO after midnight   Service Line PAT :   Ortho   Laterality PAT :   Right   Name of Contact PAT :   Daichi Moris,  spouse, [phone]   Transportation Instructions PAT :   Accompany to Hospital, Transport Home, Remain with 24 hours post-procedure   THELMA OBIE FEES E - 04/27/2019 16:04 EST   PAT Instructions Grid     THELMA, RN, [place] E - 04/27/2019 16:04 EST     Jewelry Understanding :   Verbalizes understanding   Perfume Understanding :   Verbalizes understanding   Valuables Understanding :   Verbalizes understanding   FISCHER, RN, [place] E - 04/27/2019 15:41 EST     FISCHER, RN, HELEN E - 04/27/2019 16:04 EST     Clothing Understanding :   Verbalizes understanding   Contact MD for Illness :   Verbalizes understanding   Contact MD for skin injury :   Verbalizes understanding   Prep PAT :   Hibiclens, CPAP   FISCHER, RN, HELEN E - 04/27/2019 15:41 EST     Additional Contacts PAT :   [facility] COVID 1/22, K+ at [facility] 1/22; Pt states [facility] didn't have order, did labs on 1/25.   THELMA, RN, HELEN E - 05/02/2019 14:00 EST  Harm Screen   Injuries/Abuse/Neglect in Household :   Denies   Feels Unsafe at Home :   No   Last 3 mo, thoughts killing self/others :   Patient denies   [place], Charity fundraiser, Public relations account executive C - 05/03/2019 12:08 EST

## 2019-05-03 NOTE — Anesthesia Post-Procedure Evaluation (Signed)
Postanesthesia Evaluation MF        Patient:   Sean Tate, Sean Tate             MRN: [id number]            FIN: [id number]               Age:   59 years     Sex:  Male     DOB:  1960-04-24   Associated Diagnoses:   None   Author:   Latrelle Dodrill R      Postoperative Information   Post Operative Info:          Post operative day: Post Anesthesia Care Unit.         Patient location: PACU.       Assessment   Postanesthesia assessment   Vitals: Vital Signs   05/03/2019 12:16 EST Systolic Blood Pressure 151 mmHg  HI    Diastolic Blood Pressure 97 mmHg  HI    Temperature Oral 36 degC    Heart Rate Monitored 71 bpm    Respiratory Rate 16 br/min    SpO2 96 %    SBP/DBP Cuff Details Left arm, Automated      , Measurements from flowsheet : Measurements   05/03/2019 12:16 EST Body Mass Index est meas 36.23 kg/m2    Body Mass Index Measured 36.23 kg/m2   05/03/2019 12:04 EST Weight Measured 117.4 kg    Weight Dosing 117.4 kg      .     Respiratory function: Respiratory rate, airway, and oxygen saturation are at adequate levels.     Cardiovascular function: Heart Rate stable, Postoperative hydration status Adequate.     Mental status: appropriate for level of anesthesia.     Temperature: within normal limits.     Pain Control: Adequate.     Nausea/Vomiting: Absent.     Signature Line     Electronically Signed on 05/03/2019 12:59 PM EST   ________________________________________________   Glynn Octave

## 2019-05-03 NOTE — Case Communication (Signed)
Discharge Follow-Up Form - Text       Discharge Follow-Up Entered On:  05/04/2019 14:09 EST    Performed On:  05/04/2019 14:08 EST by Courtney Paris, RN, Marifrasier C               Clinical   Provider Follow-Up Post Discharge RTF :   Follow-Up Appointments    With:  Johnnette Barrios  Address:  [address] [place], [place], [address];[phone]       Courtney Paris, RN, Marifrasier C - 05/04/2019 14:08 EST   Status   Case Status :   Completed   Reason for Removal :   Other - Add comment to explain   Other Comment :   LM   Schutte, RN, Marifrasier C - 05/04/2019 14:08 EST

## 2019-05-03 NOTE — Op Note (Signed)
Phase II Record - [facility]             Phase II Record - [facility] Summary                                                                  Primary Physician:        Ronalee Belts A    Case Number:              [id number]    Finalized Date/Time:      05/03/19 13:18:10    Pt. Name:                 Sean Tate, Sean Tate    D.O.B./Sex:               Dec 11, 1960    Male    Med Rec #:                [id number]    Physician:                Johnnette Barrios    Financial #:              [id number]    Pt. Type:                 S    Room/Bed:                 /    Admit/Disch:              05/03/19 11:52:00 -    Institution:       [facility] Case Attendance - Phase II                                                                                           Entry 1                         Entry 2                                                                          Case Attendee             [id number],  Marlynn Perking, RN,  Gerline Legacy    Role Performed            Surgeon Primary                 Phase II Nurse    Time In     Time Out     Last Modified By:         Larose Kells RN,                Larose Kells, RN,                              Gerline Legacy 05/03/19             Gerline Legacy 05/03/19                              13:05:07                        13:05:07      Jobe Gibbon - Case Times - Phase II                                                                                              Entry 1                                                                                                          Phase II In               05/03/19 12:56:00               Phase II Out                    05/03/19 13:26:00    Phase II Discharge        05/03/19 13:30:00    Time     Last Modified By:         Larose Kells RN,                              Gerline Legacy 05/03/19                              13:18:07      Jobe Gibbon - Case Times - Phase II Audit  05/03/19 13:18:07         Owner: [id number]                              Modifier: [id number]                                                       <+> 1         Phase II Out        <+> 1         Phase II Discharge Time                Finalized By: Lewis Moccasin      Document Signatures                                                                             Signed By:           Lewis Moccasin 05/03/19 13:18

## 2019-05-03 NOTE — Assessment & Plan Note (Signed)
PreOp Record - Elliot Dally Record - TDHR Summary                                                                     Primary Physician:        Ronalee Belts A    Case Number:              [id number]    Finalized Date/Time:      05/03/19 12:27:17    Pt. Name:                 Sean Tate, Sean Tate    D.O.B./Sex:               06-30-1960    Male    Med Rec #:                [id number]    Physician:                Johnnette Barrios    Financial #:              [id number]    Pt. Type:                 S    Room/Bed:                 /    Admit/Disch:              05/03/19 11:52:00 -    Institution:       [facility] - PreOp                                                                                              Entry 1                         Entry 2                                                                          Case Attendee             [email or web address]-MD,  Carney Living, RN,  Marifrasier C    Role Performed            Surgeon Primary                 Preoperative Nurse    Time In     Time Out     Last Modified By:         Winfield Cunas, RN,                              Marifrasier C 05/03/19          Marifrasier C 05/03/19                              12:03:50                        12:03:50      [facility] - Case Times - PreOp                                                                                                 Entry 1                                                                                                          Patient In Room Time      05/03/19 12:03:00               Nurse In Time                   05/03/19 12:03:00    Nurse Out Time            05/03/19 12:27:00               Patient Ready for               05/03/19 12:27:00                                                              Surgery/Procedure     Last Modified By:         Courtney Paris, RN,  Marifrasier  C 05/03/19                              12:27:06      [facility] - Case Times - PreOp Audit                                                                  05/03/19 12:27:06         Owner: [id number]                               Modifier: [id number]                                                        <+> 1         Patient Ready for Surgery/Procedure        <+> 1         Nurse Out Time                Finalized By: Haywood Pao RN, Marifrasier C      Document Signatures                                                                             Signed By:           Haywood Pao RN, Marifrasier C 05/03/19 12:27

## 2019-05-03 NOTE — Anesthesia Pre-Procedure Evaluation (Signed)
Preanesthesia Evaluation        Patient:   Sean Tate, Sean Tate             MRN: [id number]            FIN: [id number]               Age:   59 years     Sex:  Male     DOB:  08/19/1960   Associated Diagnoses:   None   Author:   Laretta Alstrom R      Preoperative Information   NPO   Anesthesia history     Patient's history: negative.     Family's history: negative.        Review of Systems   Ear/Nose/Mouth/Throat:  Negative.    Respiratory:  Negative.    Cardiovascular:  Negative.    Vascular:  Negative.       Health Status   Allergies:    Allergic Reactions (Selected)  Severe  Iodinated radiocontrast dyes- Chest pain and fever.  Moderate  Codeine- Itching.   Current medications:    Home Medications (20) Active  atorvastatin 10 mg oral tablet 10 mg = 1 tabs, Oral, At Bedtime (Once a Day)  buPROPion 300 mg/24 hours (XL) oral tablet, extended release 300 mg = 1 tabs, Oral, qAM  DULoxetine 60 mg oral delayed release capsule 60 mg = 1 caps, Oral, BID  Fish Oil oral capsule 1 caps, Oral, Daily  HYDROmorphone 4 mg oral tablet 4 mg = 1 tabs, PRN, Oral, q4hr  indapamide 1.25 mg oral tablet 1.25 mg = 1 tabs, Oral, qAM  magnesium oxide 250 mg oral tablet , Oral, Daily  metFORMIN 500 mg oral tablet 500 mg = 1 tabs, Oral, BID  Metoprolol Tartrate 25 mg oral tablet 25 mg = 1 tabs, Oral, BID  omeprazole 40 mg oral delayed release capsule 40 mg = 1 caps, Oral, Daily  orphenadrine 100 mg oral tablet, extended release 100 mg = 1 tabs, PRN, Oral, BID  oxyCODONE-acetaminophen 10 mg-325 mg oral tablet 1 tabs, PRN, Oral, q6hr  potassium citrate 10 mEq oral tablet, extended release 10 mEq = 1 tabs, Oral, TID  pramipexole 0.25 mg oral tablet 0.5 mg = 2 tabs, Oral, qPM  pregabalin 150 mg oral capsule 150 mg = 1 caps, Oral, QID  tamsulosin 0.4 mg oral capsule 0.4 mg = 1 caps, Oral, qAM  Testosterone transdermal drops , Daily  Vitamin B12 oral tablet   Vitamin D3 1,000 International_unit  Xtampza ER 18 mg oral capsule, extended release 18 mg = 1  caps, Oral, q12hr  ,    Medications (2) Active  Scheduled: (0)  Continuous: (2)  Lactated Ringers Injection solution 1,000 mL  1,000 mL, IV, 40 mL/hr  Sodium Chloride 0.9% intravenous solution 500 mL  500 mL, IV, 50 mL/hr  PRN: (0)     Problem list:    Active Problems (12)  Acid reflux   BPH (benign prostatic hyperplasia)   DM2 (diabetes mellitus, type 2)   High cholesterol   HTN (hypertension)   Joint pain   Kidney stones   Lumbar back pain   Migraine headache   Neuropathy   OSA on CPAP   Spinal stenosis, lumbar   ,    Problems   (Active Problems Only)    Neuropathy   (SNOMED CT: 4782956213, Onset: --)   Comment:  hands and feet  [name], RN, HELEN E on 04/26/19)   Kidney stone   (  SNOMED CT: 161096045, Onset: --)  Hypercholesterolemia   (SNOMED CT: 40981191, Onset: --)  Hypertensive disorder   (SNOMED CT: 4782956213, Onset: --)  Obstructive sleep apnea syndrome   (SNOMED CT: 086578469, Onset: --)  Gastroesophageal reflux disease   (SNOMED CT: 629528413, Onset: --)  Benign prostatic hyperplasia   (SNOMED CT: 244010272, Onset: --)  Joint pain   (SNOMED CT: 53664403, Onset: --)  Low back pain   (SNOMED CT: 474259563, Onset: --)  Spinal stenosis of lumbar region   (SNOMED CT: 87564332, Onset: --)  Type 2 diabetes mellitus   (SNOMED CT: 951884166, Onset: --)  Migraine   (SNOMED CT: 06301601, Onset: --)        Histories   Procedure history:    Wrist Carpal Tunnel Release or Neurolysis (Right) on 05/03/2019 at 58 Years.  Comments:  05/03/2019 12:58 EST - DENTON, RN, JENNIFER L  auto-populated from documented surgical case  Deviated septum repair.  TURP - Transurethral resection of prostate ([id number]).  Anal fissure repair.  Lithotripsy ([id number]).  Comments:  04/27/2019 15:52 EST - Chandra Batch, RN, HELEN E  x20  Basket retrieval of kidney stones x9.  Urethra repair.  Comments:  04/27/2019 15:53 EST - Chandra Batch, RN, HELEN E  after injury from catheter   Social History        Social & Psychosocial Habits    Alcohol  04/27/2019   Use: Current    Frequency: 1-2 times per year    Substance Use  04/27/2019  Opioid Assessment Opioid Complex    Use: Current    Type: Prescription medications    Tobacco  04/27/2019  Use: Never (less than 100 in l    Electronic Cigarette/Vaping  04/27/2019  Electronic Cigarette Use: Never  .     PONV Risk Score: PAT Documentation: 2                        04/27/2019 15:59 EST        Physical Examination   Measurements from flowsheet : Measurements   05/03/2019 12:16 EST Body Mass Index est meas 36.23 kg/m2    Body Mass Index Measured 36.23 kg/m2   05/03/2019 12:04 EST Weight Measured 117.4 kg    Weight Dosing 117.4 kg      Airway:          Mallampati classification: I (soft palate, fauces, uvula, pillars visible).         Thyromental Distance: Normal.         Throat: Within normal limits.    Neck:  Full range of motion.    Respiratory:  Lungs are clear to auscultation, Breath sounds are equal.    Cardiovascular:  Normal rate, No murmur, Regular rhythm.       Review / Management   Results review      Assessment and Plan   American Society of Anesthesiologists#(ASA) physical status classification:  Class III.    Anesthetic Preoperative Plan     Anesthetic technique: Monitored anesthesia care.     Maintenance airway: Mask.     Regional: Spinal.     Opioid Assessment: Opioid Na????ve.     Risks discussed: nausea, vomiting, headache, sore throat, dental injury, allergic reaction, serious complications.     Signature Line     Electronically Signed on 05/03/2019 12:58 PM EST   ________________________________________________   Glynn Octave

## 2019-05-03 NOTE — Nursing Note (Signed)
Nursing Discharge Summary - Text       Nursing Discharge Summary Entered On:  05/03/2019 13:10 EST    Performed On:  05/03/2019 13:10 EST by Wyvonne Lenz, RN, Darden Dates               DC Information   Discharge To :   Home independently   Mode of Discharge :   Ambulatory   Transportation :   Private vehicle   Accompanied By :   Earnestine Mealing, RN, Darden Dates - 05/03/2019 13:10 EST

## 2019-05-03 NOTE — Procedures (Signed)
Int[facility]Op Record - [facility]             IntraOp Record - [facility] Summary                                                                   Primary Physician:        Ronalee Belts A    Case Number:              [id number]    Finalized Date/Time:      05/03/19 12:58:26    Pt. Name:                 Sean Tate, Sean Tate    D.O.B./Sex:               Oct 09, 1960    Male    Med Rec #:                [id number]    Physician:                Johnnette Barrios    Financial #:              [id number]    Pt. Type:                 S    Room/Bed:                 /    Admit/Disch:              05/03/19 11:52:00 -    Institution:       [facility] - Case Times                                                                                                         Entry 1                                                                                                          Patient      In Room Time             05/03/19 12:30:00               Out Room Time                   05/03/19 12:56:00    Anesthesia     Procedure  Start Time               05/03/19 12:44:00               Stop Time                       05/03/19 12:53:00    Last Modified By:         Jearld Pies RN, [place]                              05/03/19 12:58:02      [facility] - Case Times Audit                                                                          05/03/19 12:58:02         Owner: Thurston Pounds                               Modifier: DENTJE                                                        <+> 1         Out Room Time     05/03/19 12:54:44         Owner: Thurston Pounds                               Modifier: DENTJE                                                        <+> 1         Stop Time        [facility] - Case Attendance                                                                                                    Entry 1                         Entry 2                         Entry 3  Case Attendee             FREELING-MD,  MICHAEL R          MCFADDEN-MD,  Carlisle Cater, RN, Lise Auer    Role Performed            Anesthesiologist                Surgeon Primary                 Circulator    Time In                   05/03/19 12:30:00               05/03/19 12:30:00               05/03/19 12:30:00    Time Out     Procedure                 Wrist Carpal Tunnel             Wrist Carpal Tunnel             Wrist Carpal Tunnel                              Release or                      Release or                      Release or                              Neurolysi(Right)                Neurolysi(Right)                Neurolysi(Right)    Last Modified By:         Thana Farr RN, Lady Deutscher, RN, Lady Deutscher, RN, JENNIFER L                              05/03/19 12:44:29               05/03/19 12:44:29               05/03/19 12:44:29                                Entry 4                                                                                                          Case Attendee  Valera Castle A    Role Performed            Surgical Scrub    Time In                   05/03/19 12:30:00    Time Out     Procedure                 Wrist Carpal Tunnel                              Release or                              Neurolysi(Right)    Last Modified By:         Thana Farr RN, JENNIFER L                              05/03/19 12:44:29      [facility] - Case Attendance Audit                                                                     05/03/19 12:44:29         Owner: Isabel Caprice                               Modifier: DENTJE                                                            1     <+> Time In            1     <*> Procedure                              Wrist Carpal Tunnel Release or Neurolysi(Right)            2     <+> Time In            2     <*> Procedure                              Wrist Carpal Tunnel Release or Neurolysi(Right)            3     <+> Time In            3     <*> Procedure                               Wrist Carpal Tunnel Release or Neurolysi(Right)            4     <+> Time In  4     <*> Procedure                              Wrist Carpal Tunnel Release or Neurolysi(Right)     05/03/19 12:44:19         Owner: DENTJE                               Modifier: DENTJE                                                        <+> 3         Case Attendee        <+> 3         Role Performed        <+> 3         Procedure        <+> 4         Case Attendee        <+> 4         Role Performed        <+> 4         Procedure        [facility] - Skin Assessment                                                                                                    Entry 1                                                                                                          Skin Integrity            Intact    Last Modified By:         Thana Farr RN, JENNIFER L                              05/03/19 12:45:16      [facility] - Patient Positioning  Entry 1                                                                                                          Procedure                 Wrist Carpal Tunnel             Body Position                   Supine                              Release or                              Neurolysi(Right)    Left Arm Position         Resting at Side                 Right Arm Position              Extended on Hand Table    Left Leg Position         Extended Security               Right Leg Position              Extended Security                              Strap, Pillow Under Knee                                        Strap, Pillow Under Knee    Feet Uncrossed            Yes                             Pressure Points                 Yes                                                              Checked     Positioning Device        C Cradle Foam, Pillow,          Positioned By                    Latrelle Dodrill  Safety Strap, Table Hand                                        R, MCFADDEN-MD,  Mayra Neer, DENTON, RN, JENNIFER                                                                                              L    Outcome Met (O.80)        Yes    Last Modified By:         Thana Farr, RN, JENNIFER L                              05/03/19 12:48:39      [facility] - Skin Prep                                                                                                          Entry 1                                                                                                          Hair Removal     Skin Prep      Prep Agents (Im.270)     Chlorhexidine Gluconate         Prep Area (Im.270)              Arm Hand Right  2% w/Alcohol     Prep By                  Ronalee BeltsMCFADDEN-MD,  JOHN A    Outcome Met (O.100)       Yes    Last Modified By:         Thana FarrENTON, RN, JENNIFER L                              05/03/19 12:45:27      [facility] - Counts Initial and Final                                                                                           Entry 1                                                                                                          Initial Counts      Initial Counts           DENTON, RN, JENNIFER L,         Items included in               Sponges, Sharps     Performed By             Valera CastleYoung,  Allison A               the Initial Count     Final Counts      Final Counts             DENTON, RN, JENNIFER L,         Final Count Status              Correct     Performed By             Valera CastleYoung,  Allison A     Items Included in        Sponges, Sharps     Final Count     Outcome Met (O.20)        Yes    Last Modified By:         Thana FarrENTON, RN, JENNIFER L                              05/03/19 12:50:10      [facility] - Counts Initial and Final Audit  05/03/19 12:50:10         Owner: Isabel Caprice                               Modifier: DENTJE                                                        <+> 1         Final Counts Performed By        <+> 1         Final Count Status        <+> 1         Items Included in Final Count        <+> 1         Outcome Met (O.20)        [facility] - General Case Data                                                                                                  Entry 1                                                                                                          Case Information      ASA Class                3                               Case Level                      Level 2     OR                       JI 02                           Specialty                       Orthopedic (SN)     Wound Class              1-Clean    Preop Diagnosis           G56.01  Postop Diagnosis                G56.01    Last Modified By:         Thana Farr RN, JENNIFER L                              05/03/19 12:49:52      [facility] - General Case Data Audit                                                                   05/03/19 12:49:52         Owner: Isabel Caprice                               Modifier: DENTJE                                                        <+> 1         ASA Class        <+> 1         Postop Diagnosis        [facility] - Fire Risk Assessment                                                                                               Entry 1                                                                                                          Fire Risk                 Alcohol Based Prep              Fire Risk Score                 2    Assessment: If            Solution, Ignition    checked, checkmark        Source In Use    = 1 point     Last Modified By:         Thana Farr, RN, JENNIFER L  05/03/19 12:47:08      [facility] - Time Out                                                                                                            Entry 1                                                                                                          Procedure                 Wrist Carpal Tunnel             Is everyone ready               Yes                              Release or                      to perform time out                               Neurolysi(Right)    Have all members of       Yes                             Patient name and                Yes    the surgical team                                         DOB confirmed     been introduced     Allergies discussed       Yes                             Surgical procedure              Yes                                                              to be performed  confirmed and                                                               verified by                                                               completed surgical                                                               consent     Correct surgical          Yes                             Correct laterality              Yes    site marked and                                           confirmed     initials are     visible through     prepped and draped     field (or     alternative ID band     used)     Correct patient           Yes                             Surgeon shares                  Yes    position confirmed                                        operative plan,                                                               possible                                                               difficulties,  expected duration,                                                               anticipated blood                                                               loss and reviews                                                                all                                                               critical/specific                                                               concerns     Required blood            Yes                             Essential imaging               Yes    products, implants,                                       available and fetal     devices and/or                                            heartones confirmed     special equipment                                         (if applicable)     available and     sterility confirmed     VTE prophylaxis           Yes                             Antibiotics ordered  Yes    addressed                                                 and administered     Anesthesia shares         Yes                             Fire risk                       Yes    anesthetic plan and                                       assessment scored     reviews patient                                           and plan discussed     specific concerns     Appropriate drying        Yes                             Surgeon states:                 Yes    time for prep                                             Does anyone have     observed before                                           any concerns? If     draping                                                   you see, suspect,                                                               or feel that                                                               patient care is  being compromised,                                                               speak up for                                                               patient safety     Time Out Complete         05/03/19 12:43:00    Last Modified By:         Thana Farr RN, JENNIFER L                              05/03/19 12:45:14      [facility] - Time Out Audit                                                                             05/03/19 12:45:14         Owner: Isabel Caprice                               Modifier: DENTJE                                                        <+> 1         Time Out Complete        <+> 1         Surgeon states: Does anyone have                      any concerns? If you see, suspect,                      or feel that patient care is being                      compromised, speak up for patient                      safety        [facility] - Debrief  Entry 1                                                                                                          Procedure                 Wrist Carpal Tunnel             Final counts                    Yes                              Release or                      correct and                               Neurolysi(Right)                verbally verified                                                               with                                                               surgeon/licensed                                                               independent                                                               practitioner (if                                                               applicable)     Actual procedure          Yes  Postop diagnosis                Yes    performed confirmed                                       confirmed     Wound                     Yes                             Confirm specimens               Yes    classification                                            and specimens     confirmed                                                 labeled                                                               appropriately (if                                                               applicable)     Foley catheter            Yes                              Patient recovery                Yes    removed (if                                               plan confirmed     applicable)     Debrief Complete          05/03/19 12:49:00    Last Modified By:         Thana Farr, RN, JENNIFER L                              05/03/19 12:50:00      [facility] - Cautery  Entry 1                                                                                                          ESU Type                  GENERATOR                       Identification                  [id number]                              COVIDIEN/[facility]              Number     Bipolar Setting           20                              Grounding Pad                   No    (watts)                                                   Needed?     Outcome Met (O.10)        Yes    Last Modified By:         Thana Farr, RN, JENNIFER L                              05/03/19 12:47:01      [facility] - Tourniquet                                                                                                         Entry 1  Tourniquet Type           TOURNIQUET PNEUMATIC            Serial Number                   [id number]    Setting                   250 mmHg                        Placement                       Arm Upper Right    Padding (Im.120)          Yes    Tourniquet Times      Inflated                 05/03/19 12:43:00               Deflated                        05/03/19 12:53:00    Applied By                Ronalee Belts A            Outcome Met (O.60)              Yes    Last Modified By:         Thana Farr RN, JENNIFER L                              05/03/19 12:54:50      [facility] - Tourniquet Audit                                                                          05/03/19 12:54:50         Owner: Isabel Caprice                                Modifier: DENTJE                                                            1     <*> Tourniquet Type                        TOURNIQUET PNEUMATIC            1     <+> Deflated        [facility] - Medications  Entry 1                         Entry 2                                                                          Time Administered         05/03/19 12:37:00               05/03/19 12:37:00    Medication                BUPIVACAINE HCL 0.5%            LIDOCAINE 2% W/EPI                              INJECTION 0.5%             VIAL    Route of Admin            Local Injection                 Local Injection    Dose/Volume               SEE COMMENT                     SEE COMMENT    (include amount and     unit of measure)     Site                      Hand                            Hand    Site Detail               Right                           Right    Administered By           MCFADDEN-MD,  Mayra Neer            MCFADDEN-MD,  JOHN A    Outcome Met (O.130)       Yes                             Yes    Last Modified By:         Thana Farr RN, Lady Deutscher, RN, JENNIFER L                              05/03/19 12:46:37               05/03/19 12:46:37    General Comments:            5 ML OF EACH MIXED, 7 ML OF MIXTURE INJECTED.      [facility] - Dressing/Packing  Entry 1                                                                                                          Site                      Hand                            Site Details                    Right    Dressing Item     Details      Dressing Item            Medicated Gauze, 4x4's,         Cast/Splint (Im.290)            Cast Padding     (Im.290)                 Tape, Bias Cut                              Stockinette    Last  Modified By:         Thana Farr, RN, JENNIFER L                              05/03/19 12:55:08      [facility] - Procedures                                                                                                         Entry 1                                                                                                          Procedure     Description      Procedure                Wrist Carpal Tunnel             Modifiers  Right                              Release or Neurolysis     Surgical Procedure       RIGHT CARPAL TUNNEL     Text                     RELEASE    Primary Procedure         Yes                             Primary Surgeon                 Johnnette Barrios    Start                     05/03/19 12:44:00               Stop                            05/03/19 12:53:00    Anesthesia Type           Monitored Anesthesia            Surgical Service                Orthopedic (SN)                              Care    Wound Class               1-Clean    Last Modified By:         Thana Farr RN, JENNIFER L                              05/03/19 12:54:53      [facility] - Transfer                                                                                                           Entry 1                                                                                                          Transferred By            Latrelle Dodrill  Via                             Cornelious Bryant, DENTON, RN, [employer]                              L    Post-op Destination       PACU    Skin Assessment      Condition                Intact    Last Modified By:         Thana Farr RN, Lise Auer                              05/03/19 12:58:18      Case Comments                                                                                         <None>              Finalized By: Thana Farr RN, Lise Auer      Document Signatures                                                                              Signed By:           Valla Leaver, JENNIFER L 05/03/19 12:58
